# Patient Record
Sex: Female | Born: 1985 | Race: White | Hispanic: No | Marital: Married | Smoking: Current every day smoker
Health system: Southern US, Community
[De-identification: ages and names within clinical notes are randomized; demographics above are authoritative.]

## PROBLEM LIST (undated history)

## (undated) DIAGNOSIS — N809 Endometriosis, unspecified: Secondary | ICD-10-CM

## (undated) DIAGNOSIS — G43909 Migraine, unspecified, not intractable, without status migrainosus: Secondary | ICD-10-CM

## (undated) DIAGNOSIS — C801 Malignant (primary) neoplasm, unspecified: Secondary | ICD-10-CM

## (undated) DIAGNOSIS — F419 Anxiety disorder, unspecified: Secondary | ICD-10-CM

## (undated) HISTORY — PX: PELVIC LAPAROSCOPY: SHX162

## (undated) HISTORY — DX: Endometriosis, unspecified: N80.9

## (undated) HISTORY — DX: Anxiety disorder, unspecified: F41.9

## (undated) HISTORY — DX: Malignant (primary) neoplasm, unspecified: C80.1

## (undated) HISTORY — DX: Migraine, unspecified, not intractable, without status migrainosus: G43.909

## (undated) HISTORY — PX: TUBAL LIGATION: SHX77

---

## 2008-06-29 DIAGNOSIS — I059 Rheumatic mitral valve disease, unspecified: Secondary | ICD-10-CM | POA: Insufficient documentation

## 2011-08-11 DIAGNOSIS — N809 Endometriosis, unspecified: Secondary | ICD-10-CM | POA: Insufficient documentation

## 2013-02-27 DIAGNOSIS — F4310 Post-traumatic stress disorder, unspecified: Secondary | ICD-10-CM | POA: Insufficient documentation

## 2016-01-03 DIAGNOSIS — N920 Excessive and frequent menstruation with regular cycle: Secondary | ICD-10-CM | POA: Insufficient documentation

## 2019-05-28 DIAGNOSIS — F411 Generalized anxiety disorder: Secondary | ICD-10-CM | POA: Insufficient documentation

## 2019-07-09 ENCOUNTER — Emergency Department (INDEPENDENT_AMBULATORY_CARE_PROVIDER_SITE_OTHER): Payer: Medicaid Other

## 2019-07-09 ENCOUNTER — Emergency Department (INDEPENDENT_AMBULATORY_CARE_PROVIDER_SITE_OTHER)
Admission: EM | Admit: 2019-07-09 | Discharge: 2019-07-09 | Disposition: A | Discharge status: Home / Self Care | Payer: Medicaid Other | Source: Home / Self Care

## 2019-07-09 ENCOUNTER — Other Ambulatory Visit: Payer: Self-pay

## 2019-07-09 ENCOUNTER — Encounter: Payer: Self-pay | Admitting: Emergency Medicine

## 2019-07-09 DIAGNOSIS — W19XXXA Unspecified fall, initial encounter: Secondary | ICD-10-CM | POA: Diagnosis not present

## 2019-07-09 DIAGNOSIS — R0789 Other chest pain: Secondary | ICD-10-CM

## 2019-07-09 DIAGNOSIS — W010XXA Fall on same level from slipping, tripping and stumbling without subsequent striking against object, initial encounter: Secondary | ICD-10-CM

## 2019-07-09 DIAGNOSIS — S4992XA Unspecified injury of left shoulder and upper arm, initial encounter: Secondary | ICD-10-CM

## 2019-07-09 HISTORY — DX: Migraine, unspecified, not intractable, without status migrainosus: G43.909

## 2019-07-09 MED ORDER — ONDANSETRON 4 MG PO TBDP
4.0000 mg | ORAL_TABLET | Freq: Once | ORAL | Status: AC
  Administered 2019-07-09: 4 mg via ORAL

## 2019-07-09 MED ORDER — KETOROLAC TROMETHAMINE 60 MG/2ML IM SOLN
60.0000 mg | Freq: Once | INTRAMUSCULAR | Status: AC
  Administered 2019-07-09: 60 mg via INTRAMUSCULAR

## 2019-07-09 NOTE — Discharge Instructions (Signed)
  You may take 500mg  acetaminophen every 4-6 hours or in combination with ibuprofen 400-600mg  every 6-8 hours as needed for pain and inflammation.  You may also alternate cool and warm compresses  Call to schedule a follow up appointment with Sports Medicine this week for further evaluation and treatment of shoulder injury.

## 2019-07-09 NOTE — ED Provider Notes (Signed)
[name]    CSN: [id number] Arrival date & time: 07/09/19  1238      History   Chief Complaint Chief Complaint  Patient presents with  . Fall    HPI Jenny Oconnell is a 34 y.o. female.   HPI  Jenny Oconnell is a 34 y.o. female presenting to UC with c/o gradually worsening severe Left shoulder and side pain after tripping at home, falling onto her radiator.  Denies hitting her head or LOC.  Pain is aching and sore, worse with any movement of her Left shoulder.  Pain radiates into her neck and upper back.  Denies numbness in her Left arm.  She has taken Tylenol w/o relief.  Pain is 10/10. No prior hx of shoulder injury or surgeries.  Pt c/o Left side rib pain as well but denies SOB. No wounds. She has noticed a small bruise on her abdomen.   Past Medical History:  Diagnosis Date  . Migraines     There are no problems to display for this patient.   History reviewed. No pertinent surgical history.  OB History   No obstetric history on file.      Home Medications    Prior to Admission medications   Medication Sig Start Date End Date Taking? Authorizing Provider  butalbital-acetaminophen-caffeine (FIORICET) 50-325-40 MG tablet Take 1 tablet at the onset of migraine, may repeat 1 tablet in 4 hours not to exceed 2 doses. MUST LAST 30 DAYS! 09/25/18  Yes [provider]  gabapentin (NEURONTIN) 600 MG tablet Take by mouth. 03/12/19  Yes [provider]  metoprolol succinate (TOPROL-XL) 100 MG 24 hr tablet Take by mouth. 09/25/15  Yes [provider]  omeprazole (PRILOSEC) 40 MG capsule TAKE ONE CAPSULE BY MOUTH DAILY 04/06/18  Yes [provider]    Family History No family history on file.  Social History Social History   Tobacco Use  . Smoking status: Current Every Day Smoker  . Smokeless tobacco: Current User  Substance Use Topics  . Alcohol use: Not on file  . Drug use: Not on file     Allergies   Nsaids, Tramadol,  Aspirin, and Sulfamethoxazole-trimethoprim   Review of Systems Review of Systems  Cardiovascular: Positive for chest pain. Negative for palpitations and leg swelling.  Musculoskeletal: Positive for arthralgias, back pain, myalgias and neck pain. Negative for gait problem, joint swelling and neck stiffness.  Neurological: Negative for dizziness, weakness, light-headedness, numbness and headaches.     Physical Exam Triage Vital Signs ED Triage Vitals  Enc Vitals Group     BP 07/09/19 1254 121/84     Pulse Rate 07/09/19 1254 77     Resp --      Temp 07/09/19 1254 98.2 F (36.8 C)     Temp Source 07/09/19 1254 Oral     SpO2 07/09/19 1254 97 %     Weight 07/09/19 1255 138 lb 12 oz (62.9 kg)     Height 07/09/19 1255 5\' 6"  (1.676 m)     Head Circumference --      Peak Flow --      Pain Score 07/09/19 1254 10     Pain Loc --      Pain Edu? --      Excl. in [place]? --    No data found.  Updated Vital Signs BP 121/84 (BP Location: Right Arm)   Pulse 77   Temp 98.2 F (36.8 C) (Oral)   Ht 5\' 6"  (1.676  m)   Wt 138 lb 12 oz (62.9 kg)   LMP 07/04/2019   SpO2 97%   BMI 22.39 kg/m   Visual Acuity Right Eye Distance:   Left Eye Distance:   Bilateral Distance:    Right Eye Near:   Left Eye Near:    Bilateral Near:     Physical Exam Vitals and nursing note reviewed.  Constitutional:      General: She is not in acute distress.    Appearance: She is well-developed and normal weight.     Comments: Pt sitting in exam chair, eyes halfway open, appears drowsy but is cooperative during exam.  HENT:     Head: Normocephalic and atraumatic.  Neck:     Comments: No midline bone tenderness, no crepitus or step-offs.  Cardiovascular:     Rate and Rhythm: Normal rate and regular rhythm.     Pulses:          Radial pulses are 2+ on the left side.  Pulmonary:     Effort: Pulmonary effort is normal. No respiratory distress.     Breath sounds: Normal breath sounds.  Chest:     Musculoskeletal:        General: Tenderness present.     Cervical back: Normal range of motion and neck supple. No tenderness.     Comments: Left arm: guarding, limited ROM Left should but is able to adduct Left hand to Right shoulder. Abduction about 90 degrees. Left elbow: full ROM, non-tender. 5/5 grip strength No spinal tenderness.  Normal gait.  Skin:    General: Skin is warm and dry.     Capillary Refill: Capillary refill takes less than 2 seconds.  Neurological:     General: No focal deficit present.     Mental Status: She is oriented to person, place, and time.  Psychiatric:        Attention and Perception: Attention and perception normal.        Mood and Affect: Mood normal.        Speech: Speech normal.        Behavior: Behavior normal.      UC Treatments / Results  Labs (all labs ordered are listed, but only abnormal results are displayed) Labs Reviewed - No data to display  EKG   Radiology DG Ribs Unilateral W/Chest Left  Result Date: 07/09/2019 CLINICAL DATA:  Pt states she fell on Friday and injured her left ribs. C/o lateral pain that increases with movement EXAM: LEFT RIBS AND CHEST - 3+ VIEW COMPARISON:  None. FINDINGS: No displaced fracture or other bone lesions are seen involving the ribs. There is no evidence of pneumothorax or pleural effusion. Both lungs are clear. Heart size and mediastinal contours are within normal limits. IMPRESSION: No evidence of a left-sided rib fracture or pneumothorax. Electronically Signed   By: Audie Pinto M.D.   On: 07/09/2019 14:15   DG Shoulder Left  Result Date: 07/09/2019 CLINICAL DATA:  Pt states she fell on Friday and injured her left shoulder. C/o lateral pain. EXAM: LEFT SHOULDER - 2+ VIEW COMPARISON:  None. FINDINGS: There is no evidence of fracture or dislocation. There is no evidence of arthropathy or other focal bone abnormality. Soft tissues are unremarkable. IMPRESSION: Negative radiographs of the left  shoulder.  Is Electronically Signed   By: Audie Pinto M.D.   On: 07/09/2019 14:13    Procedures Procedures (including critical care time)  Medications Ordered in UC Medications  ondansetron (ZOFRAN-ODT) disintegrating tablet  4 mg (has no administration in time range)  ketorolac (TORADOL) injection 60 mg (60 mg Intramuscular Given 07/09/19 1316)    Initial Impression / Assessment and Plan / UC Course  I have reviewed the triage vital signs and the nursing notes.  Pertinent labs & imaging results that were available during my care of the patient were reviewed by me and considered in my medical decision making (see chart for details).     Reviewed imaging with pt Reassured no fracture or dislocation Encouraged conservative treatment and follow up with Sports Medicine next week for further evaluation and treatment of possible rotator cuff injury. Sling offered for comfort AVS provided  Final Clinical Impressions(s) / UC Diagnoses   Final diagnoses:  Shoulder injury, left, initial encounter  Fall from slip, trip, or stumble, initial encounter  Left-sided chest wall pain     Discharge Instructions      You may take 500mg  acetaminophen every 4-6 hours or in combination with ibuprofen 400-600mg  every 6-8 hours as needed for pain and inflammation.  You may also alternate cool and warm compresses  Call to schedule a follow up appointment with Sports Medicine this week for further evaluation and treatment of shoulder injury.      ED Prescriptions    None     PDMP not reviewed this encounter.   Noe Gens, [place] 07/09/19 1422

## 2019-07-09 NOTE — ED Triage Notes (Signed)
Patient tripped and fell onto a radiator Friday night.  Complaining of left sided pain that is radiating up into her neck.  Patient has been taking Tylenol.

## 2019-07-11 ENCOUNTER — Other Ambulatory Visit: Payer: Self-pay

## 2019-07-11 ENCOUNTER — Ambulatory Visit (INDEPENDENT_AMBULATORY_CARE_PROVIDER_SITE_OTHER): Payer: Medicaid Other | Admitting: Sports Medicine

## 2019-07-11 ENCOUNTER — Encounter: Payer: Self-pay | Admitting: Sports Medicine

## 2019-07-11 ENCOUNTER — Ambulatory Visit (INDEPENDENT_AMBULATORY_CARE_PROVIDER_SITE_OTHER): Payer: Medicaid Other

## 2019-07-11 DIAGNOSIS — R0789 Other chest pain: Secondary | ICD-10-CM | POA: Diagnosis not present

## 2019-07-11 DIAGNOSIS — G894 Chronic pain syndrome: Secondary | ICD-10-CM

## 2019-07-11 DIAGNOSIS — M542 Cervicalgia: Secondary | ICD-10-CM

## 2019-07-11 MED ORDER — PREDNISONE 50 MG PO TABS: ORAL_TABLET | ORAL | 0 refills | Status: DC

## 2019-07-11 MED ORDER — CYCLOBENZAPRINE HCL 10 MG PO TABS: ORAL_TABLET | ORAL | 0 refills | Status: DC

## 2019-07-11 NOTE — Assessment & Plan Note (Signed)
Shandel has significant hyperalgesia and allodynia to light touch. This is consistent with a chronic pain syndrome, there has in fact been significant pain medication misuse. At the follow-up we may consider neuropathics or SNRIs.

## 2019-07-11 NOTE — Assessment & Plan Note (Signed)
Jenny Oconnell had a fall recently, impacted her left chest. She is in so much pain I cannot really examine her. Rib series x-rays were negative, adding a CT of the chest to evaluate for occult rib or scapular fracture.

## 2019-07-11 NOTE — Assessment & Plan Note (Addendum)
Also after recent fall 4 days ago she has pain in her left side of her neck, radiating down to the elbow. No progressive weakness, adding neck x-rays, prednisone, Flexeril. Home rehab exercises given. She does have a history of polysubstance overdose resulting in aspiration pneumonia and necessitating hospitalization so we will avoid narcotics in this patient. Soft collar given.

## 2019-07-11 NOTE — Progress Notes (Addendum)
    Procedures performed today:    None.  Independent interpretation of notes and tests performed by another provider:   None.  Impression and Recommendations:    Chest wall pain Jenny Oconnell had a fall recently, impacted her left chest. She is in so much pain I cannot really examine her. Rib series x-rays were negative, adding a CT of the chest to evaluate for occult rib or scapular fracture.  Acute neck pain Also after recent fall 4 days ago she has pain in her left side of her neck, radiating down to the elbow. No progressive weakness, adding neck x-rays, prednisone, Flexeril. Home rehab exercises given. She does have a history of polysubstance overdose resulting in aspiration pneumonia and necessitating hospitalization so we will avoid narcotics in this patient. Soft collar given.  Chronic pain syndrome Jenny Oconnell has significant hyperalgesia and allodynia to light touch. This is consistent with a chronic pain syndrome, there has in fact been significant pain medication misuse. At the follow-up we may consider neuropathics or SNRIs.    ___________________________________________ Gwen Her. Dianah Field, M.D., ABFM., CAQSM. Primary Care and [place] Instructor of [place] of [facility] [employer]

## 2019-07-12 ENCOUNTER — Telehealth: Payer: Self-pay

## 2019-07-12 ENCOUNTER — Ambulatory Visit (INDEPENDENT_AMBULATORY_CARE_PROVIDER_SITE_OTHER): Payer: Medicaid Other

## 2019-07-12 DIAGNOSIS — R0789 Other chest pain: Secondary | ICD-10-CM

## 2019-07-12 NOTE — Telephone Encounter (Signed)
Patient advised of all recommendations.

## 2019-07-12 NOTE — Telephone Encounter (Signed)
Jenny Oconnell called because she is having a lot on neck pain. She states she is not sure how to lay in bed without causing more neck pain. She is very tired because she is unable to sleep. I did recommend she continue with the medications prescribed as directed. Also advised her to have someone help with her kids so she can rest. Please advise about how to sleep/lay, such as type of pillow or position.

## 2019-07-12 NOTE — Telephone Encounter (Signed)
At this point she can go to [facility] or [facility] and get a soft cervical collar to wear for maybe a week at the most.  Take the Flexeril high-dose at night and this will help her sleep as well.  She was just hospitalized for opiate and multisubstance overdose so we really cannot go stronger than the medications she is on right now.  In the future we may consider gabapentin as well.

## 2019-07-25 ENCOUNTER — Ambulatory Visit: Payer: Medicaid Other | Admitting: Sports Medicine

## 2019-08-31 ENCOUNTER — Other Ambulatory Visit: Payer: Self-pay

## 2019-08-31 ENCOUNTER — Encounter: Payer: Self-pay | Admitting: Emergency Medicine

## 2019-08-31 ENCOUNTER — Emergency Department
Admission: EM | Admit: 2019-08-31 | Discharge: 2019-08-31 | Disposition: A | Discharge status: Home / Self Care | Payer: Medicaid Other | Source: Home / Self Care | Attending: Family Medicine | Admitting: Family Medicine

## 2019-08-31 DIAGNOSIS — J0100 Acute maxillary sinusitis, unspecified: Secondary | ICD-10-CM

## 2019-08-31 DIAGNOSIS — Z8669 Personal history of other diseases of the nervous system and sense organs: Secondary | ICD-10-CM

## 2019-08-31 DIAGNOSIS — H6122 Impacted cerumen, left ear: Secondary | ICD-10-CM | POA: Diagnosis not present

## 2019-08-31 MED ORDER — FLUCONAZOLE 150 MG PO TABS: ORAL_TABLET | ORAL | 0 refills | Status: DC

## 2019-08-31 MED ORDER — AMOXICILLIN 875 MG PO TABS: 875.0000 mg | ORAL_TABLET | Freq: Two times a day (BID) | ORAL | 0 refills | Status: DC

## 2019-08-31 MED ORDER — BUTALBITAL-APAP-CAFFEINE 50-325-40 MG PO TABS: ORAL_TABLET | ORAL | 0 refills | Status: DC

## 2019-08-31 MED ORDER — PREDNISONE 20 MG PO TABS: ORAL_TABLET | ORAL | 0 refills | Status: DC

## 2019-08-31 NOTE — ED Provider Notes (Signed)
[facility]    CSN: [id number] Arrival date & time: 08/31/19  1417      History   Chief Complaint Chief Complaint  Patient presents with  . Sinus Problem    HPI Jenny Oconnell is a 34 y.o. female.   Patient developed a cold-like illness about 3 weeks ago with initial fatigue, headache, and nasal congestion followed by a cough.  She has had persistent facial pain and pressure, but no sore throat or fevers, chills, and sweats.  She states that she has had increased frequency of migraine headaches during this interval. She requests referral to the [facility].  She also requests refill of Fiorinal for her headaches.  The history is provided by the patient.    Past Medical History:  Diagnosis Date  . Anxiety   . Cancer ([place])   . Endometriosis   . Migraine   . Migraines     Patient Active Problem List   Diagnosis Date Noted  . Acute neck pain 07/11/2019  . Chest wall pain 07/11/2019  . Chronic pain syndrome 07/11/2019    Past Surgical History:  Procedure Laterality Date  . CESAREAN SECTION    . TUBAL LIGATION      OB History   No obstetric history on file.      Home Medications    Prior to Admission medications   Medication Sig Start Date End Date Taking? Authorizing Provider  amoxicillin (AMOXIL) 875 MG tablet Take 1 tablet (875 mg total) by mouth 2 (two) times daily. 08/31/19   Kandra Nicolas, MD  butalbital-acetaminophen-caffeine (FIORICET) [phone] MG tablet Take one tab PO at onset of migraine, may repeat 1 tab in 4 hours not to exceed 2 doses. 08/31/19   Kandra Nicolas, MD  gabapentin (NEURONTIN) 600 MG tablet Take by mouth. 03/12/19   [provider]  metoprolol succinate (TOPROL-XL) 100 MG 24 hr tablet Take by mouth. 09/25/15   [provider]  omeprazole (PRILOSEC) 40 MG capsule TAKE ONE CAPSULE BY MOUTH DAILY 04/06/18   [provider]  predniSONE (DELTASONE) 20 MG tablet Take one tab by  mouth twice daily for 4 days, then one daily. Take with food. 08/31/19   Kandra Nicolas, MD    Family History Family History  Problem Relation Age of Onset  . Endometriosis Mother   . Endometriosis Maternal Aunt   . Cancer Maternal Grandmother        lung,bone,brain  . Diabetes Maternal Grandmother   . Stroke Maternal Grandmother   . Cancer Maternal Grandfather        prostate  . Diabetes Maternal Grandfather   . Heart attack Maternal Grandfather   . Thyroid disease Maternal Grandfather   . Endometriosis Maternal Aunt     Social History Social History   Tobacco Use  . Smoking status: Current Every Day Smoker    Types: Cigarettes  . Smokeless tobacco: Current User  Substance Use Topics  . Alcohol use: Not Currently  . Drug use: Not on file     Allergies   Nsaids, Tramadol, Aspirin, and Sulfamethoxazole-trimethoprim   Review of Systems Review of Systems No sore throat + cough No pleuritic pain No wheezing + nasal congestion + post-nasal drainage + sinus pain/pressure No itchy/red eyes ? earache No hemoptysis No SOB No fever/chills + nausea No vomiting No abdominal pain No diarrhea No urinary symptoms No skin rash + fatigue No myalgias + headache Used OTC meds (Tylenol) without relief  Physical Exam Triage Vital Signs ED Triage Vitals  Enc Vitals Group     BP 08/31/19 1431 111/76     Pulse Rate 08/31/19 1431 72     Resp --      Temp 08/31/19 1431 98.2 F (36.8 C)     Temp Source 08/31/19 1431 Oral     SpO2 08/31/19 1431 100 %     Weight 08/31/19 1432 135 lb (61.2 kg)     Height 08/31/19 1432 5\' 6"  (1.676 m)     Head Circumference --      Peak Flow --      Pain Score 08/31/19 1431 6     Pain Loc --      Pain Edu? --      Excl. in [place]? --    No data found.  Updated Vital Signs BP 111/76 (BP Location: Right Arm)   Pulse 72   Temp 98.2 F (36.8 C) (Oral)   Ht 5\' 6"  (1.676 m)   Wt 61.2 kg   SpO2 100%   BMI 21.79 kg/m   Visual  Acuity Right Eye Distance:   Left Eye Distance:   Bilateral Distance:    Right Eye Near:   Left Eye Near:    Bilateral Near:     Physical Exam Nursing notes and Vital Signs reviewed. Appearance:  Patient appears stated age, and in no acute distress Eyes:  Pupils are equal, round, and reactive to light and accomodation.  Extraocular movement is intact.  Conjunctivae are not inflamed  Ears:  Right canal and tympanic membrane normal.  Left canal partly occluded with cerumen; post lavage by nurse, left canal and tympanic membrane normal. Nose:  Congested turbinates.  Maxillary and frontal sinus tenderness is present.  Pharynx:  Normal Neck:  Supple.  Mildly enlarged lateral nodes are present, tender to palpation on the left.   Lungs:  Clear to auscultation.  Breath sounds are equal.  Moving air well. Heart:  Regular rate and rhythm without murmurs, rubs, or gallops.  Abdomen:  Nontender without masses or hepatosplenomegaly.  Bowel sounds are present.  No CVA or flank tenderness.  Extremities:  No edema.  Skin:  No rash present.   UC Treatments / Results  Labs (all labs ordered are listed, but only abnormal results are displayed) Labs Reviewed - No data to display  EKG   Radiology No results found.  Procedures Procedures  Left ear lavage by nurse.  Medications Ordered in UC Medications - No data to display  Initial Impression / Assessment and Plan / UC Course  I have reviewed the triage vital signs and the nursing notes.  Pertinent labs & imaging results that were available during my care of the patient were reviewed by me and considered in my medical decision making (see chart for details).    Suspect viral URI as initiating event, exacerbated by perennial rhinitis. Begin amoxicillin and prednisone burst/taper. Will refill Fioricet for migraine headaches at patient's request (#14, no refill).  Controlled Substance Prescriptions I have consulted the [place] Controlled  Substances Registry for this patient, and feel the risk/benefit ratio today is favorable for proceeding with this prescription for a controlled substance.   At patient's request, will refer to [facility] in [place] (phone [phone]) for treatment of her migraine headaches. Patient also requests Rx for Diflucan for possible candida vaginitis after taking amoxicillin. Followup with Family Doctor if not improved in about 10 days.  Final Clinical Impressions(s) / UC Diagnoses   Final diagnoses:  Acute maxillary sinusitis, recurrence not specified  Impacted cerumen of left ear  History of migraine headaches     Discharge Instructions     Take plain guaifenesin (1200mg  extended release tabs such as Mucinex) twice daily, with plenty of water, for cough and congestion.  Get adequate rest.   May use Afrin nasal spray (or generic oxymetazoline) each morning for about 5 days and then discontinue.  Also recommend using saline nasal spray several times daily and saline nasal irrigation (AYR is a common brand).  Use Flonase nasal spray each morning after using Afrin nasal spray and saline nasal irrigation. Try warm salt water gargles for sore throat.  Stop all antihistamines for now, and other non-prescription cough/cold preparations. May take Delsym Cough Suppressant at bedtime for nighttime cough.     ED Prescriptions    Medication Sig Dispense Auth. Provider   amoxicillin (AMOXIL) 875 MG tablet Take 1 tablet (875 mg total) by mouth 2 (two) times daily. 20 tablet Kandra Nicolas, MD   predniSONE (DELTASONE) 20 MG tablet Take one tab by mouth twice daily for 4 days, then one daily. Take with food. 12 tablet Kandra Nicolas, MD   butalbital-acetaminophen-caffeine (FIORICET) [phone] MG tablet Take one tab PO at onset of migraine, may repeat 1 tab in 4 hours not to exceed 2 doses. 14 tablet Kandra Nicolas, MD        Kandra Nicolas,  MD 08/31/19 [phone]

## 2019-08-31 NOTE — ED Triage Notes (Signed)
Sinus pain, pressure, headache, slight cough, nasal drainage 2-3 weeks

## 2019-08-31 NOTE — Discharge Instructions (Addendum)
Take plain guaifenesin (1200mg extended release tabs such as Mucinex) twice daily, with plenty of water, for cough and congestion.   Get adequate rest.   °May use Afrin nasal spray (or generic oxymetazoline) each morning for about 5 days and then discontinue.  Also recommend using saline nasal spray several times daily and saline nasal irrigation (AYR is a common brand).  Use Flonase nasal spray each morning after using Afrin nasal spray and saline nasal irrigation. °Try warm salt water gargles for sore throat.  °Stop all antihistamines for now, and other non-prescription cough/cold preparations. °May take Delsym Cough Suppressant at bedtime for nighttime cough.  °  °

## 2019-09-30 ENCOUNTER — Encounter: Payer: Self-pay | Admitting: Emergency Medicine

## 2019-09-30 ENCOUNTER — Other Ambulatory Visit: Payer: Self-pay

## 2019-09-30 ENCOUNTER — Emergency Department
Admission: EM | Admit: 2019-09-30 | Discharge: 2019-09-30 | Disposition: A | Discharge status: Home / Self Care | Payer: Medicaid Other | Source: Home / Self Care

## 2019-09-30 DIAGNOSIS — R0981 Nasal congestion: Secondary | ICD-10-CM

## 2019-09-30 DIAGNOSIS — R350 Frequency of micturition: Secondary | ICD-10-CM | POA: Diagnosis not present

## 2019-09-30 DIAGNOSIS — J3489 Other specified disorders of nose and nasal sinuses: Secondary | ICD-10-CM

## 2019-09-30 LAB — POCT URINALYSIS DIP (MANUAL ENTRY)
Bilirubin, UA: NEGATIVE
Glucose, UA: NEGATIVE mg/dL
Ketones, POC UA: NEGATIVE mg/dL
Leukocytes, UA: NEGATIVE
Nitrite, UA: NEGATIVE
Protein Ur, POC: NEGATIVE mg/dL
Spec Grav, UA: 1.02 (ref 1.010–1.025)
Urobilinogen, UA: 0.2 E.U./dL
pH, UA: 6 (ref 5.0–8.0)

## 2019-09-30 MED ORDER — FEXOFENADINE HCL 60 MG PO TABS: 60.0000 mg | ORAL_TABLET | Freq: Two times a day (BID) | ORAL | 0 refills | Status: DC

## 2019-09-30 MED ORDER — IPRATROPIUM BROMIDE 0.06 % NA SOLN: 2.0000 | Freq: Four times a day (QID) | NASAL | 1 refills | Status: DC

## 2019-09-30 NOTE — Discharge Instructions (Signed)
°Emergency Department Resource Guide °1) Find a Doctor and Pay Out of Pocket °Although you won't have to find out who is covered by your insurance plan, it is a good idea to ask around and get recommendations. You will then need to call the office and see if the doctor you have chosen will accept you as a new patient and what types of options they offer for patients who are self-pay. Some doctors offer discounts or will set up payment plans for their patients who do not have insurance, but you will need to ask so you aren't surprised when you get to your appointment. ° °2) Contact Your Local Health Department °Not all health departments have doctors that can see patients for sick visits, but many do, so it is worth a call to see if yours does. If you don't know where your local health department is, you can check in your phone book. The CDC also has a tool to help you locate your state's health department, and many state websites also have listings of all of their local health departments. ° °3) Find a Walk-in Clinic °If your illness is not likely to be very severe or complicated, you may want to try a walk in clinic. These are popping up all over the country in pharmacies, drugstores, and shopping centers. They're usually staffed by nurse practitioners or physician assistants that have been trained to treat common illnesses and complaints. They're usually fairly quick and inexpensive. However, if you have serious medical issues or chronic medical problems, these are probably not your best option. ° °No Primary Care Doctor: °- Call [facility] at  832-8000 - they can help you locate a primary care doctor that  accepts your insurance, provides certain services, etc. °- Physician Referral Service- 1-800-533-3463 ° °Chronic Pain Problems: °Organization         Address  Phone   Notes  ° [facility]  (336) 297-2271 Patients need to be referred by their primary care doctor.  ° °Medication  Assistance: °Organization         Address  Phone   Notes  °[place] [facility] [address] °[place], [place] [address] (336) 641-8030 --Must be a resident of [place] °-- Must have NO insurance coverage whatsoever (no Medicaid/ Medicare, etc.) °-- The pt. MUST have a primary care doctor that directs their care regularly and follows them in the community °  °[facility]  (866) 331-1348   °[employer]  (888) 892-1162   ° °Agencies that provide inexpensive medical care: °Organization         Address  Phone   Notes  °[facility]  (336) 832-8035   °[facility]    (336) 832-7272   °[facility] [address] °[place], [place] [address] (336) 832-4777   °[facility] [address], °[place] (336) 271-4999   °[facility]    (336) 373-0678   °[facility]    (336) 272-1050   °[facility] ° [address], [place] Phone:  (336) 832-4444, Fax:  (336) 832-4440 Hours of Operation:  9 am - 6 pm, M-F.  Also accepts Medicaid/Medicare and self-pay.  °[facility] ° [address], [place] Phone: (336) 832-3150, Fax: (336) 832-3151. Hours of Operation:  8:30 am - 5:30 pm, M-F.  Also accepts Medicaid and self-pay.  °[facility] [place] [address], [place] Phone: (336) 878-6027   °[facility] [address], [place], [place] (336)723-1848, Ext. 123 Mondays & Thursdays: 7-9 AM.  First 15 patients are seen on a first come, first serve basis. °  ° °Medicaid-accepting [place] Providers: ° °Organization         Address  Phone   Notes  °[facility] [address], [place] (336) 641-2100 Also accepts self-pay patients.  °[facility] [address], [place] ° (336) 856-9996   °[facility] [address], [place]  (336) 288-8857   °[facility] [address] [place] [address], [place] (336) 299-7000   °Veita Bland [address], [place]  ° (336) 373-1557 Only accepts [place] Access Medicaid patients after they have their name applied to their card.  ° °Self-Pay (no insurance) in [place]: ° °Organization         Address  Phone   Notes  °Sickle Cell Patients, [facility] [address], [place] (336) 832-1970   °[facility] [address], [place] (336) 832-4400   °[facility] ° [address], [place] (336) 992-4800   °[facility]/Dr. Osei-Bonsu ° [address] [place] [address], [place] or [address], [place] (336) 841-8500 Phone number for both [place] and [place] locations is the same.  °[facility] [address], [place] (336) 299-0000   °[facility] [address] [place] [address], [place] or [address] (336) 852-7530 °(336) 878-2260   °[facility] [address], [place] (336) 350-1642, phone; (336) 294-5005, fax Sees patients 1st and 3rd Saturday of every month.  Must not qualify for public or private insurance (i.e. Medicaid, Medicare, [place] Health Choice, Veterans' Benefits) • Household income should be no more than 200% of the poverty level •The clinic cannot treat you if you are pregnant or think you are pregnant • Sexually transmitted diseases are not treated at the clinic.  ° ° °Dental Care: °Organization         Address  Phone  Notes  °[place] [facility] [address], [place] (336) 641-6152 Accepts children up to age 21 who are enrolled in Medicaid or [place] Health Choice; pregnant women with a Medicaid card; and children who have applied for Medicaid or [place] Health Choice, but were declined, whose parents can pay a reduced fee at time of service.  °[place]  [facility] [place]  [address], [place] (336) 641-7733 Accepts children up to age 21 who are enrolled in Medicaid or [place] Health Choice; pregnant women with a Medicaid card; and children who have applied for Medicaid or [place] Health Choice, but were declined, whose parents can pay a reduced fee at time of service.  °[facility] ° [address], [place] (336) 641-4533 Patients are seen by appointment only. Walk-ins are not accepted. [facility] will see patients 18 years of age and older. °Monday - Tuesday (8am-5pm) °Most Wednesdays (8:30-5pm) °$30 per visit, cash only  °[facility] ° [address], [place] (336) 641-4533 Patients are seen by appointment only. Walk-ins are not accepted. [facility] will see patients 18 years of age and older. °One   Wednesday Evening (Monthly: Volunteer Based).  $30 per visit, cash only  °[facility]  (919) 537-3737 for adults; Children under age 4, call Graduate Pediatric Dentistry at ([phone]) [phone]. Children aged 4-14, please call ([phone]) [phone] to request a pediatric application. ° Dental services are provided in all areas of dental care including fillings, crowns and bridges, complete and partial dentures, implants, gum treatment, root canals, and extractions. Preventive care is also provided. Treatment is provided to both adults and children. °Patients are selected via a lottery and there is often a waiting list. °  °[facility] [address], °[place] ° (336) 763-8833 [email or web address] °  °[facility] [address], [place], [place] (336)723-1848, Ext. 123 Second and Fourth Thursday of each month, opens at 6:30 AM; Clinic ends at 9 AM.  Patients are seen on a first-come first-served basis, and a limited number are seen during each clinic.  ° °[facility] ° [address], [place], [place] (336) 723-7904    Eligibility Requirements °You must have lived in [place], [place], or [place] counties for at least the last three months. °  You cannot be eligible for state or federal sponsored healthcare insurance, including Veterans Administration, Medicaid, or Medicare. °  You generally cannot be eligible for healthcare insurance through your employer.  °  How to apply: °Eligibility screenings are held every Tuesday and Wednesday afternoon from 1:00 pm until 4:00 pm. You do not need an appointment for the interview!  °[facility] [address], [place], [place] 336-631-2330   °[facility]  336-342-8273   °[facility]  336-703-3100   °[facility]  336-570-6415   ° °Behavioral Health Resources in the Community: °Intensive Outpatient Programs °Organization         Address  Phone  Notes  °[facility] [address], [place], [place] 336-878-6098   °[facility] [address], [place], [place] 336-832-9800   °[facility] [address], [place], [place] ° 336-882-2125   °[facility] [address],  °[place], [place] 1-800-853-5163 or 336-641-4981   °Substance Abuse Resources °Organization         Address  Phone  Notes  °[facility]  336-882-2125   °[facility]  336-784-9470   °[facility]  336-285-9073   °[facility]  336-845-3988   °Residential & Outpatient Substance Abuse Program  1-800-659-3381   °Psychological Services °Organization         Address  Phone  Notes  °[facility]  336- 832-9600   °[facility]  336- 378-7881   °[facility] [address], [place] 1-800-853-5163 or 336-641-4981   ° °Mobile Crisis Teams °Organization         Address  Phone  Notes  °[facility], Mobile Crisis Care Unit  1-877-626-1772   °Assertive °[facility] ° [address]  [place], [place] 336-834-9664   °Sharon DeEsch [address] °[place] 336-554-5454   ° °Self-Help/Support Groups °Organization         Address  Phone             Notes  °[facility] [place] - variety of support groups  336- 373-1402 Call for more information  °Narcotics Anonymous (NA), [facility] [address], °[place]  2 meetings at this location  ° °  Residential Treatment Programs °Organization         Address  Phone  Notes  °[facility] [address],    °[place]  1-866-801-8205   °[facility] ° [address], [place], [place] 704-293-8524   °[facility] [address], [place] 336-845-3988 Admissions: 8am-3pm M-F  °[facility] [address]    °[place], [place] 336-841-1104   °[facility] [address], [place], [place] 336-379-7146   °[facility] [address]  °[place], [place] 336-285-9073   °[facility] Intensive Outpatient [address], [place], [place] 336-852-3033   °[facility] [address]  °[place], [place] 1-877-615-2722 or 336-784-9470   °[facility] [address] [place], [place] 336-227-7417 Accepts Medicaid  °[facility] [address]  ° [place] 1-800-659-3381 Substance Abuse/Addiction Treatment  ° °[place] Behavioral Health Resources °Organization         Address  Phone  Notes  °[facility]  (888) 581-9988   °Julie Brannon, PhD [address] [place], [place]   (336) 349-5553 or (336) 951-0000   °[facility]   [address] °[place], [place] (336) 349-4454   °[facility] [address], [place], [place] (336) 342-8316 Insurance/Medicaid/sponsorship through [facility]  °[facility] [address]                                    [place], [place] (336) 342-8316 Therapy/tele-psych/case    °[facility] [address]  ° , [place] (336) 349-2233    °Dr. Arfeen  (336) 349-4544   °[facility] [place]  [employer] [place] [facility] 1) [address],  °2) [address], [place] °3)  [address], [place] (336) 349-3220 °(336) 342-7768 ° °(336) 342-8140   °[place] [facility] (336) 342-1394 or (336) 342-3537 (After Hours)    ° ° °

## 2019-09-30 NOTE — ED Provider Notes (Signed)
[facility]    CSN: [id number] Arrival date & time: 09/30/19  1307      History   Chief Complaint Chief Complaint  Patient presents with   Facial Pain    HPI Jenny Oconnell is a 34 y.o. female.   HPI  Jenny Oconnell is a 34 y.o. female presenting to UC with c/o 2-3 weeks of gradually worsening sinus congestion and pressure.  She was seen at [facility] 1 month ago with similar symptoms. She completed a 10 day course of Augmentin. Symptoms resolved for about 1 week but she has since developed recurrent headaches c/w her migraines. She does have a hx of migraines. She usually takes Fioricet, and received a refill last visit to [facility] but has not f/u with her neurologist or PCP as she reports being dropped from both practices for missing appointments. She does not currently have a PCP.  Denies fever, chills, n/v/d.  Pt reports taking tylenol and 1 gabapentin this morning w/o relief of HA.  Pt also c/o urinary frequency. Hx of UTIs in the past. Denies pain with urination.  Denies vaginal symptoms.  Past Medical History:  Diagnosis Date   Anxiety    Cancer [facility])    Endometriosis    Migraine    Migraines     Patient Active Problem List   Diagnosis Date Noted   Acute neck pain 07/11/2019   Chest wall pain 07/11/2019   Chronic pain syndrome 07/11/2019    Past Surgical History:  Procedure Laterality Date   CESAREAN SECTION     TUBAL LIGATION      OB History   No obstetric history on file.      Home Medications    Prior to Admission medications   Medication Sig Start Date End Date Taking? Authorizing Provider  butalbital-acetaminophen-caffeine (FIORICET) 50-325-40 MG tablet Take one tab PO at onset of migraine, may repeat 1 tab in 4 hours not to exceed 2 doses. 08/31/19   Kandra Nicolas, MD  fexofenadine (ALLEGRA) 60 MG tablet Take 1 tablet (60 mg total) by mouth 2 (two) times daily. 09/30/19 10/30/19  Noe Gens, PA-C  fluconazole (DIFLUCAN) 150 MG tablet  Take one tab PO.  Repeat in 72 hours. 08/31/19   Kandra Nicolas, MD  gabapentin (NEURONTIN) 600 MG tablet Take by mouth. 03/12/19   [provider]  ipratropium (ATROVENT) 0.06 % nasal spray Place 2 sprays into both nostrils 4 (four) times daily. 09/30/19   Noe Gens, PA-C  metoprolol succinate (TOPROL-XL) 100 MG 24 hr tablet Take by mouth. 09/25/15   [provider]  omeprazole (PRILOSEC) 40 MG capsule TAKE ONE CAPSULE BY MOUTH DAILY 04/06/18   [provider]  predniSONE (DELTASONE) 20 MG tablet Take one tab by mouth twice daily for 4 days, then one daily. Take with food. 08/31/19   Kandra Nicolas, MD    Family History Family History  Problem Relation Age of Onset   Endometriosis Mother    Endometriosis Maternal Aunt    Cancer Maternal Grandmother        lung,bone,brain   Diabetes Maternal Grandmother    Stroke Maternal Grandmother    Cancer Maternal Grandfather        prostate   Diabetes Maternal Grandfather    Heart attack Maternal Grandfather    Thyroid disease Maternal Grandfather    Endometriosis Maternal Aunt     Social History Social History   Tobacco Use   Smoking status: Current Every  Day Smoker    Types: Cigarettes   Smokeless tobacco: Current User  Substance Use Topics   Alcohol use: Not Currently   Drug use: Not on file     Allergies   Nsaids, Tramadol, Aspirin, and Sulfamethoxazole-trimethoprim   Review of Systems Review of Systems  Constitutional: Negative for chills and fever.  HENT: Positive for congestion, postnasal drip and sinus pressure. Negative for ear pain, sinus pain, sore throat, trouble swallowing and voice change.   Respiratory: Negative for cough and shortness of breath.   Cardiovascular: Negative for chest pain and palpitations.  Gastrointestinal: Negative for abdominal pain, diarrhea, nausea and vomiting.  Musculoskeletal: Negative for arthralgias, back pain and myalgias.  Skin: Negative for  rash.  Neurological: Positive for headaches. Negative for dizziness and light-headedness.  All other systems reviewed and are negative.    Physical Exam Triage Vital Signs ED Triage Vitals  Enc Vitals Group     BP 09/30/19 1321 115/79     Pulse Rate 09/30/19 1321 85     Resp --      Temp 09/30/19 1321 98.4 F (36.9 C)     Temp Source 09/30/19 1321 Oral     SpO2 09/30/19 1321 97 %     Weight --      Height --      Head Circumference --      Peak Flow --      Pain Score 09/30/19 1322 8     Pain Loc --      Pain Edu? --      Excl. in [place]? --    No data found.  Updated Vital Signs BP 115/79 (BP Location: Left Arm)    Pulse 85    Temp 98.4 F (36.9 C) (Oral)    SpO2 97%   Visual Acuity Right Eye Distance:   Left Eye Distance:   Bilateral Distance:    Right Eye Near:   Left Eye Near:    Bilateral Near:     Physical Exam Vitals and nursing note reviewed.  Constitutional:      General: She is not in acute distress.    Appearance: Normal appearance. She is well-developed. She is not ill-appearing, toxic-appearing or diaphoretic.     Comments: Pt lying on exam bed, appears lethargic, slight slurred speech but is cooperative during exam. NAD  HENT:     Head: Normocephalic and atraumatic.     Right Ear: Tympanic membrane and ear canal normal.     Left Ear: Tympanic membrane and ear canal normal.     Nose: Nose normal.     Right Sinus: No maxillary sinus tenderness or frontal sinus tenderness.     Left Sinus: No maxillary sinus tenderness or frontal sinus tenderness.     Mouth/Throat:     Lips: Pink.     Mouth: Mucous membranes are moist.     Pharynx: Oropharynx is clear. Uvula midline.  Eyes:     Extraocular Movements: Extraocular movements intact.     Conjunctiva/sclera: Conjunctivae normal.     Pupils: Pupils are equal, round, and reactive to light.  Cardiovascular:     Rate and Rhythm: Normal rate and regular rhythm.  Pulmonary:     Effort: Pulmonary effort is  normal. No respiratory distress.     Breath sounds: Normal breath sounds. No stridor. No wheezing, rhonchi or rales.  Musculoskeletal:        General: Normal range of motion.     Cervical back: Normal range of  motion.  Skin:    General: Skin is warm and dry.  Neurological:     Mental Status: She is oriented to person, place, and time. She is lethargic.     Comments: Slowed, slightly slurred speech but is alert to person, place and time. Normal gait.   Psychiatric:        Speech: Speech is delayed and slurred ( slight).        Behavior: Behavior normal. Behavior is cooperative.        Thought Content: Thought content normal.      UC Treatments / Results  Labs (all labs ordered are listed, but only abnormal results are displayed) Labs Reviewed  POCT URINALYSIS DIP (MANUAL ENTRY) - Abnormal; Notable for the following components:      Result Value   Blood, UA moderate (*)    All other components within normal limits    EKG   Radiology No results found.  Procedures Procedures (including critical care time)  Medications Ordered in UC Medications - No data to display  Initial Impression / Assessment and Plan / UC Course  I have reviewed the triage vital signs and the nursing notes.  Pertinent labs & imaging results that were available during my care of the patient were reviewed by me and considered in my medical decision making (see chart for details).     Pt does appear lethargic with slowed slightly slurred speech on exam. Pt reports taking 1 gabapentin PTA. She notes her stepfather drove her to UC and will be driving her home today. She denies thoughts of hurting herself or others.  No sinus tenderness on exam. Lungs: CTAB UA: unremarkable No evidence of bacterial infection at this time.  Encouraged symptomatic tx for sinus congestion. Advised pt additional Fioricet would not be refilled in the urgent care setting. Strongly encouraged pt to re-establish care with a  PCP for ongoing healthcare needs and to aid with referral to neurology for reported migraines. [employer] guide provided. Discussed symptoms that warrant emergent care in the ED. AVS provided.  Final Clinical Impressions(s) / UC Diagnoses   Final diagnoses:  Urinary frequency  Sinus congestion  Sinus pressure     Discharge Instructions       Emergency Department Resource Guide 1) Find a Doctor and Pay Out of Pocket Although you won't have to find out who is covered by your insurance plan, it is a good idea to ask around and get recommendations. You will then need to call the office and see if the doctor you have chosen will accept you as a new patient and what types of options they offer for patients who are self-pay. Some doctors offer discounts or will set up payment plans for their patients who do not have insurance, but you will need to ask so you aren't surprised when you get to your appointment.  2) Contact Your Local Health Department Not all health departments have doctors that can see patients for sick visits, but many do, so it is worth a call to see if yours does. If you don't know where your local health department is, you can check in your phone book. The CDC also has a tool to help you locate your state's health department, and many state websites also have listings of all of their local health departments.  3) Find a [facility] If your illness is not likely to be very severe or complicated, you may want to try a walk in clinic. These are  popping up all over the country in pharmacies, drugstores, and shopping centers. They're usually staffed by nurse practitioners or physician assistants that have been trained to treat common illnesses and complaints. They're usually fairly quick and inexpensive. However, if you have serious medical issues or chronic medical problems, these are probably not your best option.  No Primary Care Doctor: - Call Health Connect at   [phone] - they can help you locate a primary care doctor that  accepts your insurance, provides certain services, etc. - Physician Referral Service- [phone]  Chronic Pain Problems: Organization         Address  Phone   Notes  [facility]  [phone] Patients need to be referred by their primary care doctor.   Medication Assistance: Organization         Address  Phone   Notes  [facility] Medication [facility] [address] [place], [place] [address] [phone] --Must be a resident of [facility] -- Must have NO insurance coverage whatsoever (no Medicaid/ Medicare, etc.) -- The pt. MUST have a primary care doctor that directs their care regularly and follows them in the community   MedAssist  [phone]   [employer]  [phone]    Agencies that provide inexpensive medical care: Organization         Address                                                       Phone                                                                            Notes  [place]  [phone]   [facility]    [phone]   [facility] [place], [place] [address] [phone]   [place] [address], [place] [phone]   Planned Parenthood    [phone]   [facility]    [phone]   [place] and [address], [place] Phone:  [phone], Fax:  [phone] Hours of Operation:  9 am - 6 pm, M-F.  Also accepts Medicaid/Medicare and self-pay.  [facility] for [place] [address], [address], [place] Phone: [phone], Fax: [phone]. Hours of Operation:  8:30 am - 5:30 pm, M-F.  Also accepts Medicaid and self-pay.  [facility] [place] [address] [place] Phone: [phone]   [place], [place], [place]  [phone], Ext. [phone] Mondays & Thursdays: 7-9 AM.  First 15 patients are seen on a first come, first serve basis.    [place] Providers:  Organization         Address  Phone                               Notes  [facility] [address], [place] [phone] Also accepts self-pay patients.  [facility] [id number] [place], [place]  [phone]   [place], [address], [place] [phone]   [facility] [address] [place] [phone]   Lucianne Lei [address], [place]   [phone] Only accepts [place] Access [place] patients after they have their name applied to their card.   Self-Pay (no insurance) in [facility]:   Organization         Address                                                     Phone               Notes  Sickle Cell Patients, [facility] [phone]   [facility] [place] [phone]   [facility] [place]  [place], [place], [place] [phone]   [facility]/Dr. Osei-Bonsu  [address], [place] or [address], [place] [phone] Phone number for both [place] and [place] locations is the same.  [facility] [address] [place] [phone]   [facility] [address] [place] or [address] [phone]   [facility] [address] [place] [phone], phone; [phone], fax Sees patients 1st and 3rd Saturday of every month.  Must not qualify for public or private insurance (i.e. Medicaid, Medicare, [place] Health Choice, Veterans' Benefits)   Household income should be no more than 200% of the poverty level The clinic cannot treat you if you are pregnant or think you are pregnant  Sexually transmitted diseases are not treated at the clinic.    Dental Care: Organization         Address                                  Phone                       Notes  [facility] Department of [facility] [place] [phone] Accepts children up to age 103 who are enrolled in [place] or [place]; pregnant women with a Medicaid card; and children who have applied for Medicaid or [place] Health Choice, but were declined, whose parents can pay a reduced fee at time of service.  [facility] Department of [facility]  [address], [place] [phone] Accepts children up to age 13 who are enrolled in [place] or [place]; pregnant women with a Medicaid card; and children who have applied for Medicaid or [place] Health Choice, but were declined, whose parents can pay a reduced fee at time of  service.  [place] Adult Dental Access PROGRAM  [place] [phone] Patients are seen by appointment only. Walk-ins are not accepted. [place] will see patients 27 years of age and older. Monday - Tuesday (8am-5pm) Most Wednesdays (8:30-5pm) $30 per visit, cash only  [facility] Adult Dental Access PROGRAM  [address], [facility] [phone] Patients are seen by appointment only. Walk-ins are not accepted. [place] will see patients 62 years of age and older. One Wednesday Evening (Monthly: Volunteer Based).  $30 per visit, cash only  [place]  [phone] for adults; Children under age 51, call Graduate Pediatric Dentistry at [phone]. Children aged 81-14, please call [phone] to request a pediatric application.  Dental services are provided in all areas of dental care including fillings, crowns  and bridges, complete and partial dentures, implants, gum treatment, root canals, and extractions. Preventive care is also provided. Treatment is provided to both adults and children. Patients are selected via a lottery and there is often a waiting list.   [facility] [address], [place]  [phone] [email or web address]   [facility] [address] [place], [place] [phone], Ext. [phone] Second and Fourth Thursday of each month, opens at 6:30 AM; Clinic ends at 9 AM.  Patients are seen on a first-come first-served basis, and a limited number are seen during each clinic.   [facility]  [address] [place] Danker [place], [place] [phone]   Eligibility Requirements You must have lived in [place], [place], or [place] counties for at least the last three months.   You cannot be eligible for state or federal sponsored [employer], including [employer], [place], or [employer].   You generally cannot be eligible for healthcare insurance through your employer.    How to apply: Eligibility screenings are held every Tuesday and Wednesday afternoon from 1:00 pm until 4:00 pm. You do not need an appointment for the interview!  [facility] [address] [place], [place]   [place]  [place] Department  [place]  [phone]    Behavioral Health Resources in the Community: Intensive Outpatient Programs Organization         Address                                              Phone              Notes  [place]. [address], [place], [place] [phone]   [facility] Outpatient [address] [place], [place]   ADS: Alcohol & Drug Svcs [address], [place], [place]   [place] [address],  [place], [place] or [phone]   Substance Abuse Resources Organization         Address                                Phone  Notes  Alcohol and Drug Services  [phone]   [facility]  [phone]   The Home   [facility]  [phone]   Residential &  Outpatient Substance Abuse Program  [phone]   Psychological Services Organization         Address                                  Phone                Notes  [facility] Health  [place]  [phone]   [place] [address] [place] or [phone]    Mobile Crisis Teams Organization         Address  Phone  Notes  [facility]rapeutic Alternatives, Mobile Crisis Care Unit  [phone]   [facility]  [address] [place], [place]   [place] [address], [place] [phone]    Self-Help/Support Groups Organization         Address                         Phone             Notes  [place]. of [place] - variety of support groups  [place] Call for more information  Narcotics Anonymous (NA), [facility]aring Services [address], [employer] [place]  2 meetings at this location   Special educational needs teacher         Address                                                    Phone              Notes  [facility] Residential Treatment [place],    [place]  [phone]   [facility]  [address] [place] [id number], [place], [place]   [place], [place] [phone] Admissions: 8am-3pm M-F  Incentives Substance [place] [address],    [place], [place] [phone]   [facility] [address] [place], [place], [place]   [facility] [address],  [place], [place]   Insight Programs - Intensive Outpatient [place], [place], [facility]   [facility] ([place] [address]  [place], [place] [phone] or [phone]   [facility] Services (RTS) [address] [place], [place] Accepts Medicaid  [place] [address]  [place] [phone] Substance Abuse/Addiction Treatment   [facility] Organization         Address                                                            Phone                    Notes  [facility]  [phone]   Domenic Schwab, PhD [address] [place], [place]   [phone] or [phone]   [place]  Maralee Higuchi, [place] [phone]   [facility] [address], [place], [place] [phone] Insurance/Medicaid/sponsorship through [facility] and Families [address], [address] [place], [place] [phone] [place], [place] [phone]    Dr. Adele Schilder  [phone]   [facility] Dept. 1) [address], [place] 2) [place] 3)  [place] [place] [address], [place] [phone]  [phone]   [facility] Child Abuse Hotline 919-723-1256 or ([phone] (After Hours)          ED Prescriptions    Medication Sig Dispense Auth. Provider   ipratropium (ATROVENT) 0.06 % nasal spray Place 2 sprays into both nostrils 4 (four) times daily. 15 mL Olman Yono O, PA-C   fexofenadine (ALLEGRA) 60 MG tablet Take 1 tablet (60 mg total) by mouth 2 (two) times daily. 60 tablet Noe Gens, [place]     I have reviewed the PDMP during this encounter.   Noe Gens, [place] 10/01/19 1103

## 2019-09-30 NOTE — ED Triage Notes (Signed)
Patient c/o sinus pain, pressure, nasal drainage 2-3 weeks, possible kidney infection as well.

## 2020-11-11 ENCOUNTER — Emergency Department (INDEPENDENT_AMBULATORY_CARE_PROVIDER_SITE_OTHER): Payer: Medicaid Other

## 2020-11-11 ENCOUNTER — Encounter: Payer: Self-pay | Admitting: Emergency Medicine

## 2020-11-11 ENCOUNTER — Other Ambulatory Visit: Payer: Self-pay

## 2020-11-11 ENCOUNTER — Emergency Department
Admission: EM | Admit: 2020-11-11 | Discharge: 2020-11-11 | Disposition: A | Discharge status: Home / Self Care | Payer: Medicaid Other | Source: Home / Self Care

## 2020-11-11 DIAGNOSIS — R059 Cough, unspecified: Secondary | ICD-10-CM

## 2020-11-11 DIAGNOSIS — R509 Fever, unspecified: Secondary | ICD-10-CM | POA: Diagnosis not present

## 2020-11-11 DIAGNOSIS — R0602 Shortness of breath: Secondary | ICD-10-CM

## 2020-11-11 DIAGNOSIS — U071 COVID-19: Secondary | ICD-10-CM | POA: Diagnosis not present

## 2020-11-11 DIAGNOSIS — R5383 Other fatigue: Secondary | ICD-10-CM | POA: Diagnosis not present

## 2020-11-11 LAB — POC SARS CORONAVIRUS 2 AG -  ED: SARS Coronavirus 2 Ag: POSITIVE — AB

## 2020-11-11 MED ORDER — PREDNISONE 10 MG (21) PO TBPK: ORAL_TABLET | ORAL | 0 refills | Status: DC

## 2020-11-11 MED ORDER — PROMETHAZINE-DM 6.25-15 MG/5ML PO SYRP: 5.0000 mL | ORAL_SOLUTION | Freq: Two times a day (BID) | ORAL | 0 refills | Status: DC | PRN

## 2020-11-11 NOTE — ED Provider Notes (Signed)
[name]    CSN: [id number] Arrival date & time: 11/11/20  1022      History   Chief Complaint Chief Complaint  Patient presents with   Fever   Covid Exposure    HPI Jenny Oconnell is a 35 y.o. female.   Patient presents today with a 5 to 6-day history of URI symptoms.  She reports symptoms have been persistent and gradually worsening prompting evaluation today.  She has not had influenza or COVID-19 vaccinations.  Does report exposure to COVID-19 2 days prior to symptom onset.  She reports fever, body aches, abdominal pain, nausea, cough, shortness of breath, sore throat, sinus pressure.  Denies any vomiting, diarrhea, dizziness, syncope.  She has tried Tylenol without improvement of symptoms.  She is unable to take most over-the-counter medications due to allergies.  She denies any recent antibiotic use.  She is a current everyday smoker smoking her typical amount.  She denies history of chronic lung conditions such as asthma or COPD.  She is having difficulty with daily activities as result of symptoms.   Past Medical History:  Diagnosis Date   Anxiety    Cancer [facility])    Endometriosis    Migraine    Migraines     Patient Active Problem List   Diagnosis Date Noted   Acute neck pain 07/11/2019   Chest wall pain 07/11/2019   Chronic pain syndrome 07/11/2019    Past Surgical History:  Procedure Laterality Date   CESAREAN SECTION     TUBAL LIGATION      OB History   No obstetric history on file.      Home Medications    Prior to Admission medications   Medication Sig Start Date End Date Taking? Authorizing Provider  predniSONE (STERAPRED UNI-PAK 21 TAB) 10 MG (21) TBPK tablet As directed 11/11/20  Yes Arya Boxley K, PA-C  promethazine-dextromethorphan (PROMETHAZINE-DM) 6.25-15 MG/5ML syrup Take 5 mLs by mouth 2 (two) times daily as needed for cough. 11/11/20  Yes Jacobs Golab, Derry Skill, PA-C  butalbital-acetaminophen-caffeine (FIORICET) 50-325-40 MG tablet  Take one tab PO at onset of migraine, may repeat 1 tab in 4 hours not to exceed 2 doses. Patient not taking: Reported on 11/11/2020 08/31/19   Kandra Nicolas, MD  fexofenadine (ALLEGRA) 60 MG tablet Take 1 tablet (60 mg total) by mouth 2 (two) times daily. Patient not taking: Reported on 11/11/2020 09/30/19 10/30/19  Noe Gens, PA-C  fluconazole (DIFLUCAN) 150 MG tablet Take one tab PO.  Repeat in 72 hours. 08/31/19   Kandra Nicolas, MD  gabapentin (NEURONTIN) 600 MG tablet Take by mouth. Patient not taking: Reported on 11/11/2020 03/12/19   [provider]  ipratropium (ATROVENT) 0.06 % nasal spray Place 2 sprays into both nostrils 4 (four) times daily. Patient not taking: Reported on 11/11/2020 09/30/19   Noe Gens, PA-C  metoprolol succinate (TOPROL-XL) 100 MG 24 hr tablet Take by mouth. 09/25/15   [provider]  omeprazole (PRILOSEC) 40 MG capsule TAKE ONE CAPSULE BY MOUTH DAILY 04/06/18   [provider]    Family History Family History  Problem Relation Age of Onset   Endometriosis Mother    Cancer Maternal Grandmother        lung,bone,brain   Diabetes Maternal Grandmother    Stroke Maternal Grandmother    Cancer Maternal Grandfather        prostate   Diabetes Maternal Grandfather    Heart attack Maternal Grandfather  Thyroid disease Maternal Grandfather    Endometriosis Maternal Aunt    Endometriosis Maternal Aunt     Social History Social History   Tobacco Use   Smoking status: Every Day    Packs/day: 0.50    Pack years: 0.00    Types: Cigarettes   Smokeless tobacco: Current  Vaping Use   Vaping Use: Some days   Substances: Nicotine  Substance Use Topics   Alcohol use: Not Currently   Drug use: Never     Allergies   Nsaids, Tramadol, Aspirin, and Sulfamethoxazole-trimethoprim   Review of Systems Review of Systems  Constitutional:  Positive for activity change, appetite change, fatigue and fever.  HENT:  Positive for  congestion and sore throat. Negative for sinus pressure and sneezing.   Respiratory:  Positive for cough and shortness of breath.   Cardiovascular:  Negative for chest pain.  Gastrointestinal:  Positive for abdominal pain and nausea. Negative for diarrhea and vomiting.  Musculoskeletal:  Positive for arthralgias and myalgias.  Neurological:  Positive for headaches. Negative for dizziness and light-headedness.    Physical Exam Triage Vital Signs ED Triage Vitals  Enc Vitals Group     BP 11/11/20 1050 114/81     Pulse Rate 11/11/20 1050 85     Resp 11/11/20 1050 16     Temp 11/11/20 1050 98.9 F (37.2 C)     Temp Source 11/11/20 1050 Oral     SpO2 11/11/20 1050 98 %     Weight --      Height --      Head Circumference --      Peak Flow --      Pain Score 11/11/20 1051 5     Pain Loc --      Pain Edu? --      Excl. in [place]? --    No data found.  Updated Vital Signs BP 114/81 (BP Location: Right Arm)   Pulse 85   Temp 98.9 F (37.2 C) (Oral)   Resp 16   LMP 10/28/2020 (Approximate)   SpO2 98%   Visual Acuity Right Eye Distance:   Left Eye Distance:   Bilateral Distance:    Right Eye Near:   Left Eye Near:    Bilateral Near:     Physical Exam Vitals reviewed.  Constitutional:      General: She is awake. She is not in acute distress.    Appearance: Normal appearance. She is normal weight. She is not ill-appearing.     Comments: Very pleasant female appears stated age in no acute distress sitting comfortably in exam room  HENT:     Head: Normocephalic and atraumatic.     Right Ear: Tympanic membrane, ear canal and external ear normal. There is impacted cerumen.     Left Ear: Tympanic membrane, ear canal and external ear normal. There is impacted cerumen.     Ears:     Comments: Cerumen noted bilaterally; able to visualize approximately 20% of TM that appears normal.    Nose:     Right Sinus: Maxillary sinus tenderness present.     Left Sinus: Maxillary sinus  tenderness present.     Mouth/Throat:     Pharynx: Uvula midline. Posterior oropharyngeal erythema present. No oropharyngeal exudate.     Tonsils: No tonsillar exudate or tonsillar abscesses. 1+ on the right. 1+ on the left.     Comments: Moderate erythema posterior pharynx. Cardiovascular:     Rate and Rhythm: Normal rate and regular  rhythm.     Heart sounds: Normal heart sounds, S1 normal and S2 normal. No murmur heard. Pulmonary:     Effort: Pulmonary effort is normal.     Breath sounds: Rhonchi present. No wheezing or rales.     Comments: Scattered rhonchi clear with cough Lymphadenopathy:     Head:     Right side of head: No submental, submandibular or tonsillar adenopathy.     Left side of head: No submental, submandibular or tonsillar adenopathy.     Cervical: No cervical adenopathy.  Psychiatric:        Behavior: Behavior is cooperative.     UC Treatments / Results  Labs (all labs ordered are listed, but only abnormal results are displayed) Labs Reviewed  POC SARS CORONAVIRUS 2 AG -  ED - Abnormal; Notable for the following components:      Result Value   SARS Coronavirus 2 Ag Positive (*)    All other components within normal limits    EKG   Radiology DG Chest 2 View  Result Date: 11/11/2020 CLINICAL DATA:  Cough, headache, fever EXAM: CHEST - 2 VIEW COMPARISON:  07/09/2019 FINDINGS: The heart size and mediastinal contours are within normal limits. Both lungs are clear. The visualized skeletal structures are unremarkable. IMPRESSION: Normal study. Electronically Signed   By: Rolm Baptise M.D.   On: 11/11/2020 12:15    Procedures Procedures (including critical care time)  Medications Ordered in UC Medications - No data to display  Initial Impression / Assessment and Plan / UC Course  I have reviewed the triage vital signs and the nursing notes.  Pertinent labs & imaging results that were available during my care of the patient were reviewed by me and  considered in my medical decision making (see chart for details).      Patient is positive for COVID-19 based on a rapid COVID test today.  Chest x-ray obtained showed no acute abnormalities.  No indication for antibiotics so we will start prednisone taper and patient was instructed not to take NSAIDs with this medication due to risk of GI bleeding.  She is outside of the 5-day window for oral antivirals.  Recommended use over-the-counter medications including Mucinex and Flonase for symptom relief.  Discussed alarm symptoms that warrant emergent evaluation.  Recommend she follow-up with either our clinic or PCP within 1 week to ensure improvement of symptoms.  Strict return precautions given to which patient expressed understanding.  Final Clinical Impressions(s) / UC Diagnoses   Final diagnoses:  COVID-19  Cough  Shortness of breath     Discharge Instructions      Your chest x-ray was normal.  You are positive for COVID.  We are going to start you on a prednisone taper to help with your breathing.  Please do not take NSAIDs including aspirin, ibuprofen/Advil, naproxen/Aleve with this medication as it can cause stomach bleeding.  You can continue Tylenol for pain relief.  I have called in Promethazine DM for cough.  This make you sleepy do not drive or drink alcohol with it.  If you have any worsening symptoms including shortness of breath, high fever, nausea/vomiting, weakness, chest pain you need to go to the emergency room.  Please follow-up with your primary care provider within 1 week.     ED Prescriptions     Medication Sig Dispense Auth. Provider   predniSONE (STERAPRED UNI-PAK 21 TAB) 10 MG (21) TBPK tablet As directed 21 tablet Diamonds Lippard K, PA-C   promethazine-dextromethorphan (  PROMETHAZINE-DM) 6.25-15 MG/5ML syrup Take 5 mLs by mouth 2 (two) times daily as needed for cough. 118 mL Shiryl Ruddy K, PA-C      PDMP not reviewed this encounter.   Terrilee Croak,  PA-C 11/11/20 1243

## 2020-11-11 NOTE — Discharge Instructions (Addendum)
Your chest x-ray was normal.  You are positive for COVID.  We are going to start you on a prednisone taper to help with your breathing.  Please do not take NSAIDs including aspirin, ibuprofen/Advil, naproxen/Aleve with this medication as it can cause stomach bleeding.  You can continue Tylenol for pain relief.  I have called in Promethazine DM for cough.  This make you sleepy do not drive or drink alcohol with it.  If you have any worsening symptoms including shortness of breath, high fever, nausea/vomiting, weakness, chest pain you need to go to the emergency room.  Please follow-up with your primary care provider within 1 week.

## 2020-11-11 NOTE — ED Triage Notes (Signed)
Covid exposure on 7/5 Presents today w/ cough, body aches & fever at home since 11/06/20

## 2021-02-01 IMAGING — DX DG SHOULDER 2+V*L*
3 series · 3 of 3 positions shown · non-contrast
Comparison: None.

CLINICAL DATA: Pt states she fell on [REDACTED] and injured her left
shoulder. C/o lateral pain.

EXAM:
LEFT SHOULDER - 2+ VIEW

[shoulder grashey]
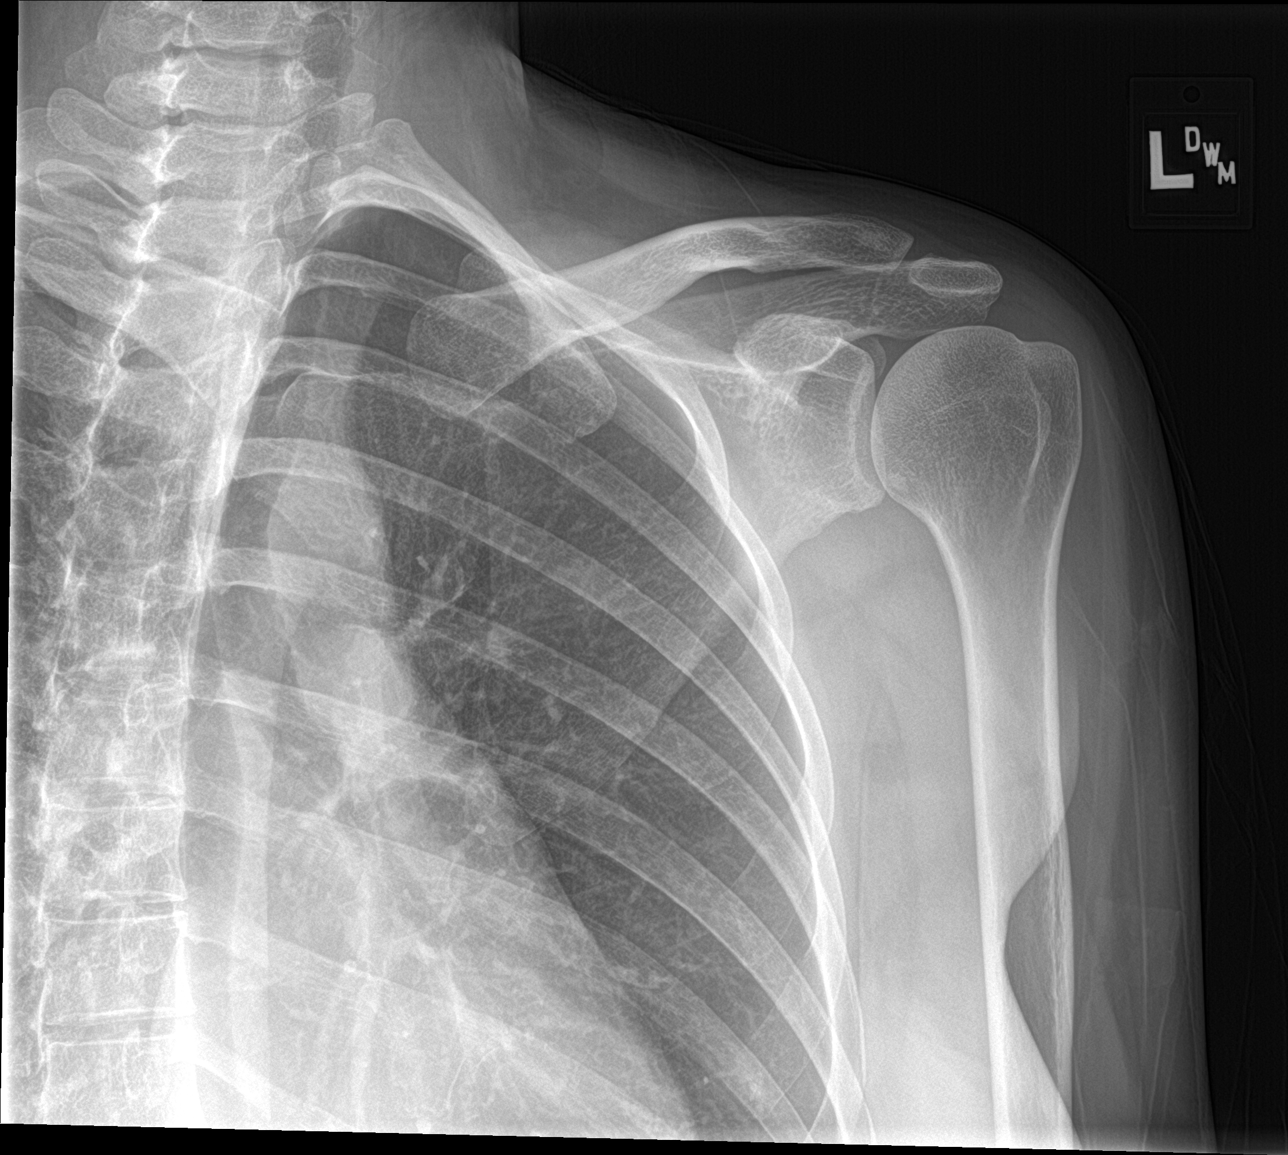

[shoulder axillary]
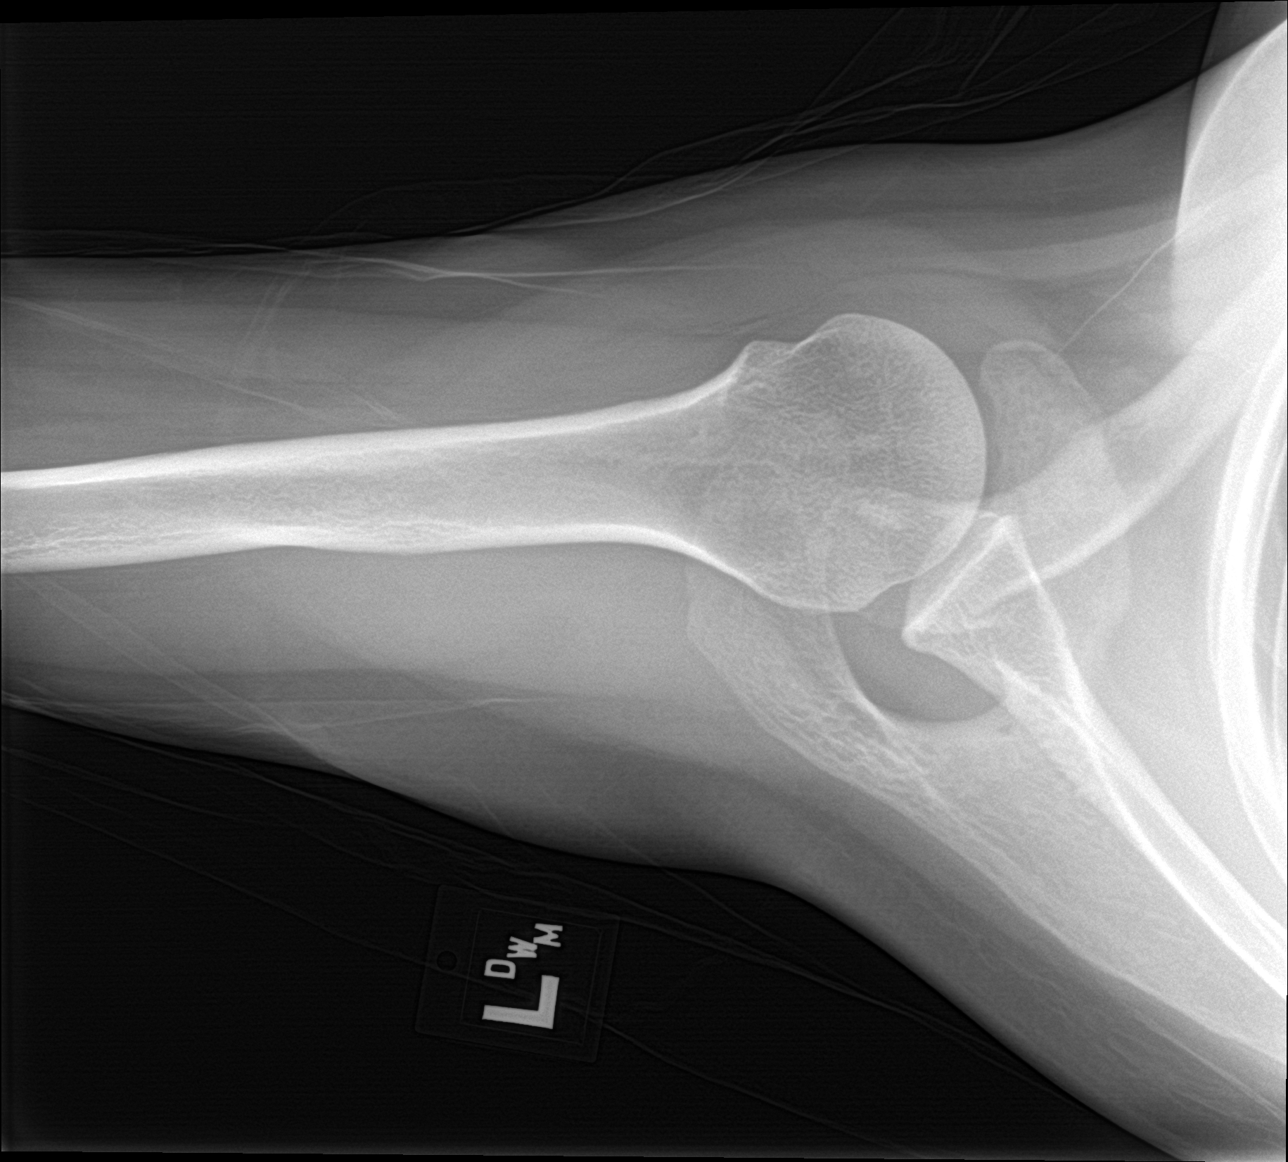

[shoulder y view]
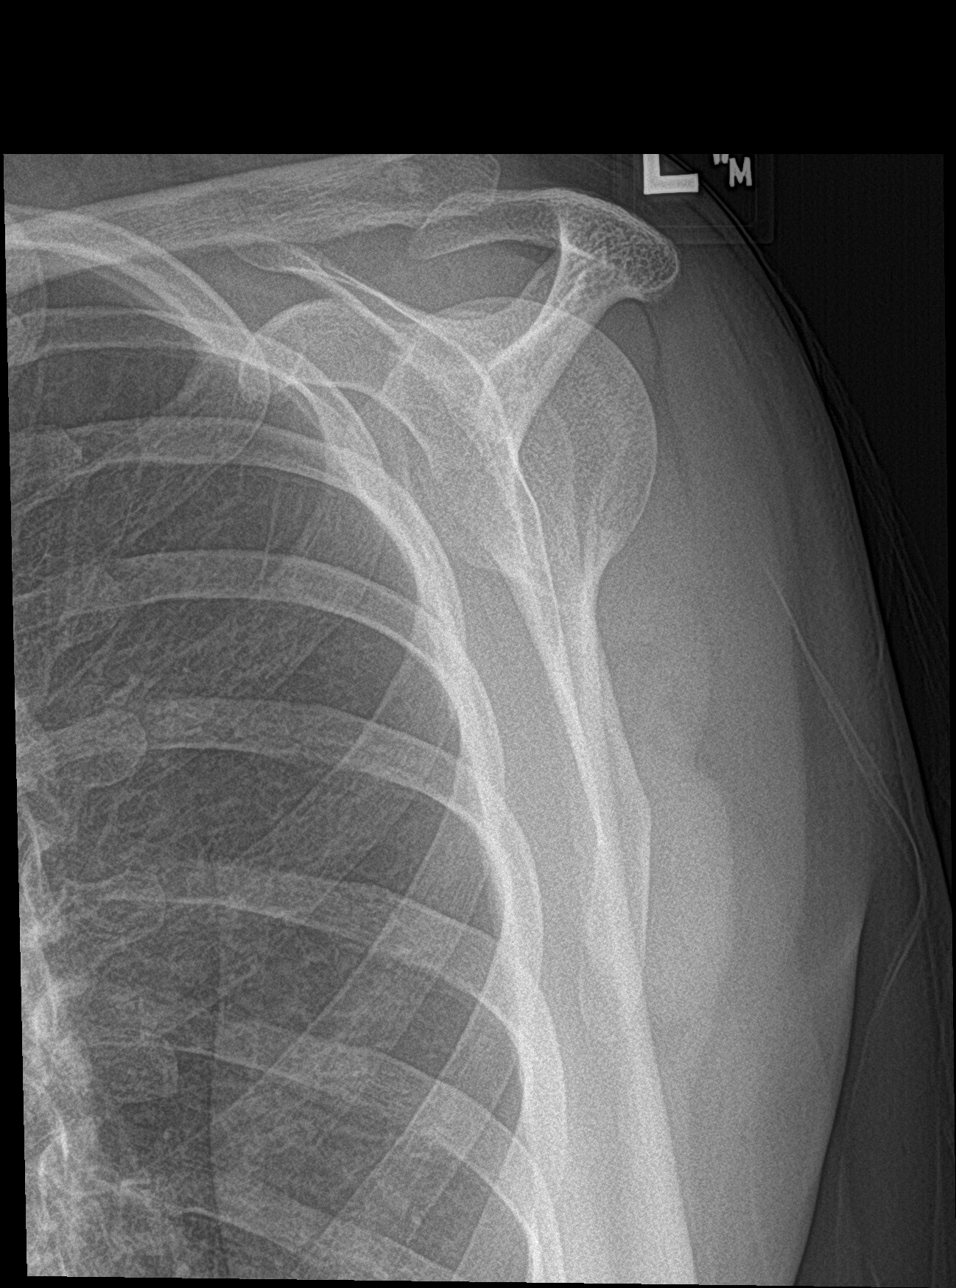

[3 of 3 positions shown; findings below may reference images not displayed]

FINDINGS: There is no evidence of fracture or dislocation. There is no
evidence of arthropathy or other focal bone abnormality. Soft
tissues are unremarkable.
IMPRESSION: Negative radiographs of the left shoulder.  Is

## 2021-03-03 ENCOUNTER — Emergency Department (INDEPENDENT_AMBULATORY_CARE_PROVIDER_SITE_OTHER)
Admission: EM | Admit: 2021-03-03 | Discharge: 2021-03-03 | Disposition: A | Discharge status: Home / Self Care | Payer: Medicaid Other | Source: Home / Self Care | Attending: Family Medicine | Admitting: Family Medicine

## 2021-03-03 ENCOUNTER — Other Ambulatory Visit: Payer: Self-pay

## 2021-03-03 ENCOUNTER — Emergency Department (INDEPENDENT_AMBULATORY_CARE_PROVIDER_SITE_OTHER): Payer: Medicaid Other

## 2021-03-03 DIAGNOSIS — L03811 Cellulitis of head [any part, except face]: Secondary | ICD-10-CM | POA: Diagnosis not present

## 2021-03-03 DIAGNOSIS — R52 Pain, unspecified: Secondary | ICD-10-CM

## 2021-03-03 MED ORDER — HYDROCODONE-ACETAMINOPHEN 5-325 MG PO TABS: 1.0000 | ORAL_TABLET | Freq: Four times a day (QID) | ORAL | 0 refills | Status: AC | PRN

## 2021-03-03 MED ORDER — DOXYCYCLINE HYCLATE 100 MG PO CAPS: ORAL_CAPSULE | ORAL | 0 refills | Status: DC

## 2021-03-03 MED ORDER — ONDANSETRON 4 MG PO TBDP
4.0000 mg | ORAL_TABLET | Freq: Once | ORAL | Status: AC
  Administered 2021-03-03: 4 mg via ORAL

## 2021-03-03 MED ORDER — ONDANSETRON 4 MG PO TBDP: 4.0000 mg | ORAL_TABLET | Freq: Three times a day (TID) | ORAL | 0 refills | Status: DC | PRN

## 2021-03-03 NOTE — Discharge Instructions (Signed)
Apply warm compress 3 to 4 times daily. If symptoms become significantly worse during the night or over the weekend, proceed to the local emergency room.  

## 2021-03-03 NOTE — ED Provider Notes (Signed)
[name]    CSN: [id number] Arrival date & time: 03/03/21  6644      History   Chief Complaint Chief Complaint  Patient presents with   skin lesion    head    HPI Jenny Oconnell is a 35 y.o. female.   Patient complains of development of a sore spot on her right posterior scalp three days ago, and she wonders if it could be a cyst.  She denies trauma or drainage from the lesion, reporting that it has become increasingly painful.  The history is provided by the patient.   Past Medical History:  Diagnosis Date   Anxiety    Cancer [facility])    Endometriosis    Migraine    Migraines     Patient Active Problem List   Diagnosis Date Noted   Acute neck pain 07/11/2019   Chest wall pain 07/11/2019   Chronic pain syndrome 07/11/2019    Past Surgical History:  Procedure Laterality Date   CESAREAN SECTION     TUBAL LIGATION      OB History   No obstetric history on file.      Home Medications    Prior to Admission medications   Medication Sig Start Date End Date Taking? Authorizing Provider  doxycycline (VIBRAMYCIN) 100 MG capsule Take one cap PO Q12hr with food. 03/03/21  Yes Kandra Nicolas, MD  HYDROcodone-acetaminophen (NORCO/VICODIN) 5-325 MG tablet Take 1 tablet by mouth every 6 (six) hours as needed for up to 7 days. 03/03/21 03/10/21 Yes Kandra Nicolas, MD  butalbital-acetaminophen-caffeine (FIORICET) [phone] MG tablet Take one tab PO at onset of migraine, may repeat 1 tab in 4 hours not to exceed 2 doses. Patient not taking: No sig reported 08/31/19   Kandra Nicolas, MD  fexofenadine (ALLEGRA) 60 MG tablet Take 1 tablet (60 mg total) by mouth 2 (two) times daily. Patient not taking: No sig reported 09/30/19 10/30/19  Noe Gens, PA-C  fluconazole (DIFLUCAN) 150 MG tablet Take one tab PO.  Repeat in 72 hours. 08/31/19   Kandra Nicolas, MD  gabapentin (NEURONTIN) 600 MG tablet Take by mouth. Patient not taking: No sig reported 03/12/19    [provider]  ipratropium (ATROVENT) 0.06 % nasal spray Place 2 sprays into both nostrils 4 (four) times daily. Patient not taking: No sig reported 09/30/19   Noe Gens, PA-C  metoprolol succinate (TOPROL-XL) 100 MG 24 hr tablet Take by mouth. 09/25/15   [provider]  omeprazole (PRILOSEC) 40 MG capsule TAKE ONE CAPSULE BY MOUTH DAILY 04/06/18   [provider]  predniSONE (STERAPRED UNI-PAK 21 TAB) 10 MG (21) TBPK tablet As directed 11/11/20   Raspet, Erin K, PA-C  promethazine-dextromethorphan (PROMETHAZINE-DM) 6.25-15 MG/5ML syrup Take 5 mLs by mouth 2 (two) times daily as needed for cough. 11/11/20   Raspet, Derry Skill, PA-C    Family History Family History  Problem Relation Age of Onset   Endometriosis Mother    Alcohol abuse Father    Cancer Maternal Grandmother        lung,bone,brain   Diabetes Maternal Grandmother    Stroke Maternal Grandmother    Cancer Maternal Grandfather        prostate   Diabetes Maternal Grandfather    Heart attack Maternal Grandfather    Thyroid disease Maternal Grandfather    Endometriosis Maternal Aunt    Endometriosis Maternal Aunt     Social History Social History   Tobacco Use  Smoking status: Every Day    Packs/day: 0.50    Types: Cigarettes   Smokeless tobacco: Current  Vaping Use   Vaping Use: Some days   Substances: Nicotine  Substance Use Topics   Alcohol use: Not Currently   Drug use: Never     Allergies   Nsaids, Tramadol, Aspirin, Decongestant [pseudoephedrine hcl], and Sulfamethoxazole-trimethoprim   Review of Systems Review of Systems  Constitutional:  Negative for activity change, appetite change, chills, diaphoresis, fatigue and fever.  HENT: Negative.    Eyes: Negative.   Respiratory: Negative.    Cardiovascular: Negative.   Gastrointestinal: Negative.   Genitourinary: Negative.   Musculoskeletal: Negative.   Skin:  Positive for color change.  Neurological:  Positive for  headaches.    Physical Exam Triage Vital Signs ED Triage Vitals  Enc Vitals Group     BP 03/03/21 1134 102/72     Pulse Rate 03/03/21 1134 63     Resp 03/03/21 1134 20     Temp 03/03/21 1134 98.4 F (36.9 C)     Temp Source 03/03/21 1134 Oral     SpO2 03/03/21 1134 98 %     Weight 03/03/21 1128 135 lb (61.2 kg)     Height 03/03/21 1128 5\' 6"  (1.676 m)     Head Circumference --      Peak Flow --      Pain Score 03/03/21 1128 10     Pain Loc --      Pain Edu? --      Excl. in [place]? --    No data found.  Updated Vital Signs BP 102/72 (BP Location: Left Arm)   Pulse 63   Temp 98.4 F (36.9 C) (Oral)   Resp 20   Ht 5\' 6"  (1.676 m)   Wt 61.2 kg   LMP 02/01/2021 (Approximate)   SpO2 98%   BMI 21.79 kg/m   Visual Acuity Right Eye Distance:   Left Eye Distance:   Bilateral Distance:    Right Eye Near:   Left Eye Near:    Bilateral Near:     Physical Exam Vitals and nursing note reviewed.  Constitutional:      General: She is not in acute distress. HENT:     Head: Normocephalic.      Comments: Right posterior scalp has a non-specific 0.5cm by 1cm patch of erythema, tender to palpation.  The area surrounding the lesion is also tender to palpation as noted on diagram.  There is no induration or fluctuance.      Right Ear: External ear normal.     Left Ear: External ear normal.  Eyes:     Pupils: Pupils are equal, round, and reactive to light.  Cardiovascular:     Rate and Rhythm: Normal rate.  Pulmonary:     Effort: Pulmonary effort is normal.  Musculoskeletal:     Cervical back: Neck supple.  Lymphadenopathy:     Cervical: No cervical adenopathy.  Skin:    General: Skin is warm and dry.  Neurological:     Mental Status: She is alert and oriented to person, place, and time.    UC Treatments / Results  Labs (all labs ordered are listed, but only abnormal results are displayed) Labs Reviewed - No data to display  EKG   Radiology DG Skull  Complete  Result Date: 03/03/2021 CLINICAL DATA:  Painful area of the right posterior scalp over the last 3 days. EXAM: SKULL - COMPLETE 4 + VIEW  COMPARISON:  None. FINDINGS: Normal appearance of the calvarium. No bone lesions seen. No discernible soft tissue finding by radiography. IMPRESSION: Negative radiographs. No calvarial abnormality in the region of concern. No discernible soft tissue lesion. Electronically Signed   By: Nelson Chimes M.D.   On: 03/03/2021 13:06    Procedures Procedures (including critical care time)  Medications Ordered in UC Medications  ondansetron (ZOFRAN-ODT) disintegrating tablet 4 mg (has no administration in time range)    Initial Impression / Assessment and Plan / UC Course  I have reviewed the triage vital signs and the nursing notes.  Pertinent labs & imaging results that were available during my care of the patient were reviewed by me and considered in my medical decision making (see chart for details).    Skull x-ray shows no underlying bony pathology.  Suspect cellulitis.  Begin empiric doxycycline. Administered Zofran ODT 4mg  PO at patient's request. Rx for Vicodin (#10, no refill). Controlled Substance Prescriptions I have consulted the [place] Controlled Substances Registry for this patient, and feel the risk/benefit ratio today is favorable for proceeding with this prescription for a controlled substance.   Followup with dermatologist if not improved one week.  Final Clinical Impressions(s) / UC Diagnoses   Final diagnoses:  Cellulitis of scalp     Discharge Instructions      Apply warm compress 3 to 4 times daily.  If symptoms become significantly worse during the night or over the weekend, proceed to the local emergency room.      ED Prescriptions     Medication Sig Dispense Auth. Provider   doxycycline (VIBRAMYCIN) 100 MG capsule Take one cap PO Q12hr with food. 14 capsule Kandra Nicolas, MD   HYDROcodone-acetaminophen  (NORCO/VICODIN) 5-325 MG tablet Take 1 tablet by mouth every 6 (six) hours as needed for up to 7 days. 10 tablet Kandra Nicolas, MD         Kandra Nicolas, MD 03/04/21 [phone]

## 2021-03-03 NOTE — ED Triage Notes (Signed)
Pt presents to Urgent Care with c/o painful "cyst" on her head x 3 days. Pink-colored skin lesion to scalp noted near crown on R side/back of head.

## 2021-08-16 ENCOUNTER — Emergency Department
Admission: EM | Admit: 2021-08-16 | Discharge: 2021-08-16 | Disposition: A | Discharge status: Home / Self Care | Payer: Medicaid Other | Source: Home / Self Care | Attending: Family Medicine | Admitting: Family Medicine

## 2021-08-16 ENCOUNTER — Emergency Department (INDEPENDENT_AMBULATORY_CARE_PROVIDER_SITE_OTHER): Payer: Medicaid Other

## 2021-08-16 DIAGNOSIS — Z8701 Personal history of pneumonia (recurrent): Secondary | ICD-10-CM

## 2021-08-16 DIAGNOSIS — R052 Subacute cough: Secondary | ICD-10-CM | POA: Diagnosis not present

## 2021-08-16 DIAGNOSIS — R059 Cough, unspecified: Secondary | ICD-10-CM

## 2021-08-16 MED ORDER — PREDNISONE 10 MG (21) PO TBPK: ORAL_TABLET | ORAL | 0 refills | Status: DC

## 2021-08-16 MED ORDER — PROMETHAZINE-DM 6.25-15 MG/5ML PO SYRP: 5.0000 mL | ORAL_SOLUTION | Freq: Two times a day (BID) | ORAL | 0 refills | Status: DC | PRN

## 2021-08-16 NOTE — ED Triage Notes (Signed)
Pt presents to Urgent Care with c/o productive cough x approx 2 months. Was dx w/ pneumonia approx 3 weeks ago--has taken two rounds of Doxycycline w/o improvement.  ?

## 2021-08-16 NOTE — Discharge Instructions (Signed)
Your chest x-ray is back to normal ?The chest pain on the right is probably pleurisy related to your prior pneumonia ?You may also have rib strain from the coughing ?I am prescribing a prednisone pack.  This will help take down the pain and inflammation of your rib cage. ?I am giving you a stronger cough medicine Promethazine DM ?Drink lots of fluids ?See your doctor if not better by next week ?

## 2021-08-16 NOTE — ED Provider Notes (Signed)
?[place] ? ? ? ?CSN: [id number] ?Arrival date & time: 08/16/21  1203 ? ? ?  ? ?History   ?Chief Complaint ?Chief Complaint  ?Patient presents with  ? Cough  ? ? ?HPI ?Jenny Oconnell is a 36 y.o. female.  ? ?HPI ? ?Patient states that she has been coughing for 2 months.  She went to her primary care doctor.  She was treated with a course of doxycycline for community pneumonia.  She states that she went back to see them after couple of weeks.  She was treated with another course of doxycycline.  She says that she has seen no improvement from this.  She continues to cough.  Her right chest hurts with deep breath.  She feels very tired.  No fever or chills.  No body aches.  She does continue to smoke cigarettes.  She states she does not have underlying lung disease or asthma ? ?Past Medical History:  ?Diagnosis Date  ? Anxiety   ? Cancer [facility])   ? Endometriosis   ? Migraine   ? Migraines   ? ? ?Patient Active Problem List  ? Diagnosis Date Noted  ? Acute neck pain 07/11/2019  ? Chest wall pain 07/11/2019  ? Chronic pain syndrome 07/11/2019  ? ? ?Past Surgical History:  ?Procedure Laterality Date  ? CESAREAN SECTION    ? TUBAL LIGATION    ? ? ?OB History   ?No obstetric history on file. ?  ? ? ? ?Home Medications   ? ?Prior to Admission medications   ?Medication Sig Start Date End Date Taking? Authorizing Provider  ?fluconazole (DIFLUCAN) 150 MG tablet Take one tab PO.  Repeat in 72 hours. 08/31/19   Kandra Nicolas, MD  ?gabapentin (NEURONTIN) 600 MG tablet Take by mouth. 03/12/19   [provider]  ?metoprolol succinate (TOPROL-XL) 100 MG 24 hr tablet Take by mouth. 09/25/15   [provider]  ?omeprazole (PRILOSEC) 40 MG capsule TAKE ONE CAPSULE BY MOUTH DAILY 04/06/18   [provider]  ?ondansetron (ZOFRAN-ODT) 4 MG disintegrating tablet Take 1 tablet (4 mg total) by mouth every 8 (eight) hours as needed for nausea or vomiting. Dissolve under tongue. 03/03/21   Kandra Nicolas, MD   ?predniSONE (STERAPRED UNI-PAK 21 TAB) 10 MG (21) TBPK tablet As directed 08/16/21   Raylene Everts, MD  ?promethazine-dextromethorphan (PROMETHAZINE-DM) 6.25-15 MG/5ML syrup Take 5 mLs by mouth 2 (two) times daily as needed for cough. 08/16/21   Raylene Everts, MD  ? ? ?Family History ?Family History  ?Problem Relation Age of Onset  ? Endometriosis Mother   ? Alcohol abuse Father   ? Cancer Maternal Grandmother   ?     lung,bone,brain  ? Diabetes Maternal Grandmother   ? Stroke Maternal Grandmother   ? Cancer Maternal Grandfather   ?     prostate  ? Diabetes Maternal Grandfather   ? Heart attack Maternal Grandfather   ? Thyroid disease Maternal Grandfather   ? Endometriosis Maternal Aunt   ? Endometriosis Maternal Aunt   ? ? ?Social History ?Social History  ? ?Tobacco Use  ? Smoking status: Every Day  ?  Packs/day: 0.50  ?  Types: Cigarettes  ? Smokeless tobacco: Never  ?Vaping Use  ? Vaping Use: Some days  ? Substances: Nicotine  ?Substance Use Topics  ? Alcohol use: Not Currently  ? Drug use: Never  ? ? ? ?Allergies   ?Nsaids, Tramadol, Azithromycin, Aspirin, Decongestant [pseudoephedrine hcl], and  Sulfamethoxazole-trimethoprim ? ? ?Review of Systems ?Review of Systems ? ?See HPI ?Physical Exam ?Triage Vital Signs ?ED Triage Vitals  ?Enc Vitals Group  ?   BP 08/16/21 1339 125/88  ?   Pulse Rate 08/16/21 1339 74  ?   Resp 08/16/21 1339 20  ?   Temp 08/16/21 1339 97.8 ?F (36.6 ?C)  ?   Temp Source 08/16/21 1339 Oral  ?   SpO2 08/16/21 1339 100 %  ?   Weight 08/16/21 1333 138 lb (62.6 kg)  ?   Height 08/16/21 1333 '5\' 6"'$  (1.676 m)  ?   Head Circumference --   ?   Peak Flow --   ?   Pain Score 08/16/21 1332 9  ?   Pain Loc --   ?   Pain Edu? --   ?   Excl. in [place]? --   ? ?No data found. ? ?Updated Vital Signs ?BP 125/88 (BP Location: Right Arm)   Pulse 74   Temp 97.8 ?F (36.6 ?C) (Oral)   Resp 20   Ht '5\' 6"'$  (1.676 m)   Wt 62.6 kg   LMP 08/02/2021 (Approximate)   SpO2 100%   BMI 22.27 kg/m?  ?    ? ?Physical Exam ?Constitutional:   ?   General: She is not in acute distress. ?   Appearance: Normal appearance. She is well-developed and normal weight. She is ill-appearing.  ?   Comments: Appears tired.  Smells strongly of tobacco  ?HENT:  ?   Head: Normocephalic and atraumatic.  ?   Right Ear: Tympanic membrane and ear canal normal.  ?   Left Ear: Tympanic membrane and ear canal normal.  ?   Nose: Nose normal.  ?   Mouth/Throat:  ?   Mouth: Mucous membranes are dry.  ?Eyes:  ?   Conjunctiva/sclera: Conjunctivae normal.  ?   Pupils: Pupils are equal, round, and reactive to light.  ?Cardiovascular:  ?   Rate and Rhythm: Normal rate and regular rhythm.  ?   Heart sounds: Normal heart sounds.  ?Pulmonary:  ?   Effort: Pulmonary effort is normal. No respiratory distress.  ?   Breath sounds: No wheezing or rhonchi.  ?Abdominal:  ?   General: There is no distension.  ?   Palpations: Abdomen is soft.  ?Musculoskeletal:     ?   General: Normal range of motion.  ?   Cervical back: Normal range of motion.  ?Lymphadenopathy:  ?   Cervical: No cervical adenopathy.  ?Skin: ?   General: Skin is warm and dry.  ?Neurological:  ?   Mental Status: She is alert.  ?Psychiatric:     ?   Mood and Affect: Mood normal.     ?   Behavior: Behavior normal.  ? ? ? ?UC Treatments / Results  ?Labs ?(all labs ordered are listed, but only abnormal results are displayed) ?Labs Reviewed - No data to display ? ?EKG ? ? ?Radiology ?DG Chest 2 View ? ?Result Date: 08/16/2021 ?CLINICAL DATA:  Cough EXAM: CHEST - 2 VIEW COMPARISON:  Chest x-ray 11/11/2020 FINDINGS: Heart size and mediastinal contours are within normal limits. No suspicious pulmonary opacities identified. No pleural effusion or pneumothorax visualized. No acute osseous abnormality appreciated. IMPRESSION: No acute intrathoracic process identified. Electronically Signed   By: Ofilia Neas M.D.   On: 08/16/2021 14:24   ? ?Procedures ?Procedures (including critical care  time) ? ?Medications Ordered in UC ?Medications - No data to display ? ?  Initial Impression / Assessment and Plan / UC Course  ?I have reviewed the triage vital signs and the nursing notes. ? ?Pertinent labs & imaging results that were available during my care of the patient were reviewed by me and considered in my medical decision making (see chart for details). ? ?  ?Final Clinical Impressions(s) / UC Diagnoses  ? ?Final diagnoses:  ?Subacute cough  ?History of pneumonia, recurrent  ? ? ? ?Discharge Instructions   ? ?  ?Your chest x-ray is back to normal ?The chest pain on the right is probably pleurisy related to your prior pneumonia ?You may also have rib strain from the coughing ?I am prescribing a prednisone pack.  This will help take down the pain and inflammation of your rib cage. ?I am giving you a stronger cough medicine Promethazine DM ?Drink lots of fluids ?See your doctor if not better by next week ? ? ?ED Prescriptions   ? ? Medication Sig Dispense Auth. Provider  ? promethazine-dextromethorphan (PROMETHAZINE-DM) 6.25-15 MG/5ML syrup Take 5 mLs by mouth 2 (two) times daily as needed for cough. 118 mL Raylene Everts, MD  ? predniSONE (STERAPRED UNI-PAK 21 TAB) 10 MG (21) TBPK tablet As directed 21 tablet Raylene Everts, MD  ? ?  ? ?PDMP not reviewed this encounter. ?  ?Raylene Everts, MD ?08/16/21 1518 ? ?

## 2022-05-25 ENCOUNTER — Ambulatory Visit
Admission: EM | Admit: 2022-05-25 | Discharge: 2022-05-25 | Discharge status: Absent without leave | Payer: Medicaid Other

## 2022-06-10 ENCOUNTER — Ambulatory Visit
Admission: RE | Admit: 2022-06-10 | Discharge: 2022-06-10 | Disposition: A | Discharge status: Home / Self Care | Payer: Medicaid Other | Source: Ambulatory Visit | Attending: Family Medicine | Admitting: Family Medicine

## 2022-06-10 VITALS — BP 122/84 | HR 69 | Temp 98.4°F | Resp 18 | Ht 66.0 in | Wt 145.0 lb

## 2022-06-10 DIAGNOSIS — G43909 Migraine, unspecified, not intractable, without status migrainosus: Secondary | ICD-10-CM | POA: Insufficient documentation

## 2022-06-10 DIAGNOSIS — F1910 Other psychoactive substance abuse, uncomplicated: Secondary | ICD-10-CM | POA: Insufficient documentation

## 2022-06-10 DIAGNOSIS — A084 Viral intestinal infection, unspecified: Secondary | ICD-10-CM

## 2022-06-10 DIAGNOSIS — Z9189 Other specified personal risk factors, not elsewhere classified: Secondary | ICD-10-CM | POA: Insufficient documentation

## 2022-06-10 LAB — POC SARS CORONAVIRUS 2 AG -  ED: SARS Coronavirus 2 Ag: NEGATIVE

## 2022-06-10 LAB — POCT INFLUENZA A/B
Influenza A, POC: NEGATIVE
Influenza B, POC: NEGATIVE

## 2022-06-10 MED ORDER — ONDANSETRON 4 MG PO TBDP
4.0000 mg | ORAL_TABLET | Freq: Once | ORAL | Status: AC
  Administered 2022-06-10: 4 mg via ORAL

## 2022-06-10 MED ORDER — ONDANSETRON 8 MG PO TBDP: 8.0000 mg | ORAL_TABLET | Freq: Three times a day (TID) | ORAL | 0 refills | Status: DC | PRN

## 2022-06-10 NOTE — Discharge Instructions (Signed)
Take the Zofran as needed for nausea and vomiting Increase your fluid intake May take Tylenol or ibuprofen for pain and fever See your doctor if not improving by next week

## 2022-06-10 NOTE — ED Provider Notes (Signed)
[facility]    CSN: [id number] Arrival date & time: 06/10/22  1247      History   Chief Complaint Chief Complaint  Patient presents with   Nausea    HPI Jenny Oconnell is a 37 y.o. female.   HPI  Patient known to me from prior visit.  Today she is here with nausea and vomiting.  Tired.  States she has had some chills probable fever.  Has been exposed to recently to influenza. Denies sore throat, runny nose or cough Medical history, medication list reviewed   Past Medical History:  Diagnosis Date   Anxiety    Cancer ([place])    Endometriosis    Migraine    Migraines     Patient Active Problem List   Diagnosis Date Noted   Substance abuse ([place]) 06/10/2022   History of repeated overdose 06/10/2022   Migraine 06/10/2022   Acute neck pain 07/11/2019   Chest wall pain 07/11/2019   Chronic pain syndrome 07/11/2019    Past Surgical History:  Procedure Laterality Date   CESAREAN SECTION     PELVIC LAPAROSCOPY     TUBAL LIGATION      OB History   No obstetric history on file.      Home Medications    Prior to Admission medications   Medication Sig Start Date End Date Taking? Authorizing Provider  amitriptyline (ELAVIL) 25 MG tablet Take 25 mg by mouth at bedtime. 05/10/22  Yes [provider]  DULoxetine (CYMBALTA) 60 MG capsule Take 60 mg by mouth daily. 05/08/22  Yes [provider]  ondansetron (ZOFRAN-ODT) 8 MG disintegrating tablet Take 1 tablet (8 mg total) by mouth every 8 (eight) hours as needed for nausea or vomiting. 06/10/22  Yes Raylene Everts, MD  QUEtiapine (SEROQUEL) 100 MG tablet Take 100 mg by mouth at bedtime. 06/01/22  Yes [provider]  QUEtiapine (SEROQUEL) 25 MG tablet Take 25 mg by mouth 2 (two) times daily as needed. 05/16/22  Yes [provider]  butalbital-acetaminophen-caffeine (FIORICET) 50-325-40 MG tablet Take 1 tablet by mouth daily as needed.    [provider]  gabapentin  (NEURONTIN) 600 MG tablet Take by mouth. 03/12/19   [provider]  metoprolol succinate (TOPROL-XL) 100 MG 24 hr tablet Take by mouth. 09/25/15   [provider]  omeprazole (PRILOSEC) 40 MG capsule TAKE ONE CAPSULE BY MOUTH DAILY 04/06/18   [provider]    Family History Family History  Problem Relation Age of Onset   Endometriosis Mother    Alcohol abuse Father    Cancer Maternal Grandmother        lung,bone,brain   Diabetes Maternal Grandmother    Stroke Maternal Grandmother    Cancer Maternal Grandfather        prostate   Diabetes Maternal Grandfather    Heart attack Maternal Grandfather    Thyroid disease Maternal Grandfather    Endometriosis Maternal Aunt    Endometriosis Maternal Aunt     Social History Social History   Tobacco Use   Smoking status: Every Day    Packs/day: 0.50    Types: Cigarettes   Smokeless tobacco: Never  Vaping Use   Vaping Use: Some days   Substances: Nicotine, Flavoring  Substance Use Topics   Alcohol use: Not Currently   Drug use: Not Currently     Allergies   Nsaids, Tramadol, Azithromycin, Aspirin, Decongestant [pseudoephedrine hcl], and Sulfamethoxazole-trimethoprim   Review of Systems Review of Systems  See HPI  Physical Exam Triage Vital Signs ED Triage Vitals  Enc Vitals Group     BP 06/10/22 1305 122/84     Pulse Rate 06/10/22 1305 69     Resp 06/10/22 1305 18     Temp 06/10/22 1305 98.4 F (36.9 C)     Temp Source 06/10/22 1305 Oral     SpO2 06/10/22 1305 97 %     Weight 06/10/22 1306 145 lb (65.8 kg)     Height 06/10/22 1306 '5\' 6"'$  (1.676 m)     Head Circumference --      Peak Flow --      Pain Score 06/10/22 1306 4     Pain Loc --      Pain Edu? --      Excl. in [place]? --    No data found.  Updated Vital Signs BP 122/84 (BP Location: Right Arm)   Pulse 69   Temp 98.4 F (36.9 C) (Oral)   Resp 18   Ht '5\' 6"'$  (1.676 m)   Wt 65.8 kg   LMP 06/10/2022 (Exact Date)   SpO2 97%    BMI 23.40 kg/m      Physical Exam Constitutional:      General: She is not in acute distress.    Appearance: She is well-developed. She is ill-appearing.  HENT:     Head: Normocephalic and atraumatic.  Eyes:     Conjunctiva/sclera: Conjunctivae normal.     Pupils: Pupils are equal, round, and reactive to light.  Cardiovascular:     Rate and Rhythm: Normal rate.  Pulmonary:     Effort: Pulmonary effort is normal. No respiratory distress.  Abdominal:     General: Abdomen is flat. There is no distension.     Palpations: Abdomen is soft.     Tenderness: There is no abdominal tenderness.  Musculoskeletal:        General: Normal range of motion.     Cervical back: Normal range of motion.  Skin:    General: Skin is warm and dry.  Neurological:     Mental Status: She is alert.      UC Treatments / Results  Labs (all labs ordered are listed, but only abnormal results are displayed) Labs Reviewed  POC SARS CORONAVIRUS 2 AG -  ED  POCT INFLUENZA A/B    EKG   Radiology No results found.  Procedures Procedures (including critical care time)  Medications Ordered in UC Medications  ondansetron (ZOFRAN-ODT) disintegrating tablet 4 mg (4 mg Oral Given 06/10/22 1304)    Initial Impression / Assessment and Plan / UC Course  I have reviewed the triage vital signs and the nursing notes.  Pertinent labs & imaging results that were available during my care of the patient were reviewed by me and considered in my medical decision making (see chart for details).     Final Clinical Impressions(s) / UC Diagnoses   Final diagnoses:  Viral gastroenteritis     Discharge Instructions      Take the Zofran as needed for nausea and vomiting Increase your fluid intake May take Tylenol or ibuprofen for pain and fever See your doctor if not improving by next week     ED Prescriptions     Medication Sig Dispense Auth. Provider   ondansetron (ZOFRAN-ODT) 8 MG disintegrating  tablet Take 1 tablet (8 mg total) by mouth every 8 (eight) hours as needed for nausea or vomiting. 20 tablet Raylene Everts, MD  PDMP not reviewed this encounter.   Raylene Everts, MD 06/10/22 1346

## 2022-06-10 NOTE — ED Triage Notes (Signed)
N/V since yesterday  Zofran given in triage  Denies fever Chills at home  Daughter had flu last week

## 2022-06-11 ENCOUNTER — Telehealth: Payer: Self-pay | Admitting: Emergency Medicine

## 2022-06-11 NOTE — Telephone Encounter (Signed)
Attempted to contact patient, no answer.  Doing a follow up regarding her appt to see how patient was doing and if she had any questions.

## 2024-02-17 ENCOUNTER — Ambulatory Visit: Payer: Self-pay

## 2024-02-17 ENCOUNTER — Ambulatory Visit (INDEPENDENT_AMBULATORY_CARE_PROVIDER_SITE_OTHER): Payer: MEDICAID

## 2024-02-17 ENCOUNTER — Ambulatory Visit
Admission: EM | Admit: 2024-02-17 | Discharge: 2024-02-17 | Disposition: A | Discharge status: Home / Self Care | Payer: MEDICAID | Source: Ambulatory Visit | Attending: Family Medicine | Admitting: Family Medicine

## 2024-02-17 ENCOUNTER — Other Ambulatory Visit: Payer: Self-pay

## 2024-02-17 DIAGNOSIS — R051 Acute cough: Secondary | ICD-10-CM | POA: Diagnosis not present

## 2024-02-17 DIAGNOSIS — R0789 Other chest pain: Secondary | ICD-10-CM

## 2024-02-17 DIAGNOSIS — F17200 Nicotine dependence, unspecified, uncomplicated: Secondary | ICD-10-CM | POA: Diagnosis not present

## 2024-02-17 MED ORDER — PREDNISONE 20 MG PO TABS: 40.0000 mg | ORAL_TABLET | Freq: Every day | ORAL | 0 refills | Status: DC

## 2024-02-17 MED ORDER — PROMETHAZINE-DM 6.25-15 MG/5ML PO SYRP: 5.0000 mL | ORAL_SOLUTION | Freq: Four times a day (QID) | ORAL | 0 refills | Status: DC | PRN

## 2024-02-17 MED ORDER — ALBUTEROL SULFATE HFA 108 (90 BASE) MCG/ACT IN AERS: 1.0000 | INHALATION_SPRAY | Freq: Four times a day (QID) | RESPIRATORY_TRACT | 0 refills | Status: AC | PRN

## 2024-02-17 MED ORDER — DOXYCYCLINE HYCLATE 100 MG PO CAPS: 100.0000 mg | ORAL_CAPSULE | Freq: Two times a day (BID) | ORAL | 0 refills | Status: DC

## 2024-02-17 NOTE — ED Provider Notes (Signed)
 Jenny Oconnell    CSN: [id number] Arrival date & time: 02/17/24  1035      History   Chief Complaint Chief Complaint  Patient presents with   Cough    HPI Jenny Oconnell is a 38 y.o. female.   Patient is known to me from prior visits.  She is a smoker.  She is here because she has had a cough for a month.  She has been exposed to pneumonia.  She has chest pain from coughing.  She is fatigued.  She is coughing up sputum.  She hurts around her right rib cage.  Does not know if she has had a fever.  Did not do any COVID or flu testing.  Is here because the cough is persisting.  Her aunt was recently diagnosed with pneumonia    Past Medical History:  Diagnosis Date   Anxiety    Cancer [facility])    Endometriosis    Migraine    Migraines     Patient Active Problem List   Diagnosis Date Noted   Substance abuse ([facility]) 06/10/2022   History of repeated overdose 06/10/2022   Migraine 06/10/2022   Acute neck pain 07/11/2019   Chest wall pain 07/11/2019   Chronic pain syndrome 07/11/2019    Past Surgical History:  Procedure Laterality Date   CESAREAN SECTION     PELVIC LAPAROSCOPY     TUBAL LIGATION      OB History   No obstetric history on file.      Home Medications    Prior to Admission medications   Medication Sig Start Date End Date Taking? Authorizing Provider  albuterol (VENTOLIN HFA) 108 (90 Base) MCG/ACT inhaler Inhale 1-2 puffs into the lungs every 6 (six) hours as needed for wheezing or shortness of breath. 02/17/24  Yes Maranda Jamee Jacob, MD  doxycycline  (VIBRAMYCIN ) 100 MG capsule Take 1 capsule (100 mg total) by mouth 2 (two) times daily. 02/17/24  Yes Maranda Jamee Jacob, MD  predniSONE  (DELTASONE ) 20 MG tablet Take 2 tablets (40 mg total) by mouth daily with breakfast. 02/17/24  Yes Maranda Jamee Jacob, MD  promethazine -dextromethorphan (PROMETHAZINE -DM) 6.25-15 MG/5ML syrup Take 5 mLs by mouth 4 (four) times daily as needed for cough. 02/17/24  Yes  Maranda Jamee Jacob, MD  gabapentin (NEURONTIN) 600 MG tablet Take by mouth. 03/12/19   [provider]  metoprolol succinate (TOPROL-XL) 100 MG 24 hr tablet Take by mouth. 09/25/15   [provider]    Family History Family History  Problem Relation Age of Onset   Endometriosis Mother    Alcohol abuse Father    Cancer Maternal Grandmother        lung,bone,brain   Diabetes Maternal Grandmother    Stroke Maternal Grandmother    Cancer Maternal Grandfather        prostate   Diabetes Maternal Grandfather    Heart attack Maternal Grandfather    Thyroid disease Maternal Grandfather    Endometriosis Maternal Aunt    Endometriosis Maternal Aunt     Social History Social History   Tobacco Use   Smoking status: Every Day    Current packs/day: 0.50    Types: Cigarettes   Smokeless tobacco: Never  Vaping Use   Vaping status: Some Days   Substances: Nicotine, Flavoring  Substance Use Topics   Alcohol use: Not Currently   Drug use: Not Currently     Allergies   Nsaids, Tramadol, Azithromycin, Aspirin, Decongestant [pseudoephedrine hcl], and Sulfamethoxazole-trimethoprim  Review of Systems Review of Systems See HPI  Physical Exam Triage Vital Signs ED Triage Vitals  Encounter Vitals Group     BP 02/17/24 1043 115/76     Girls Systolic BP Percentile --      Girls Diastolic BP Percentile --      Boys Systolic BP Percentile --      Boys Diastolic BP Percentile --      Pulse Rate 02/17/24 1043 78     Resp 02/17/24 1043 16     Temp 02/17/24 1043 97.9 F (36.6 C)     Temp src --      SpO2 02/17/24 1043 96 %     Weight --      Height --      Head Circumference --      Peak Flow --      Pain Score 02/17/24 1047 8     Pain Loc --      Pain Education --      Exclude from Growth Chart --    No data found.  Updated Vital Signs BP 115/76   Pulse 78   Temp 97.9 F (36.6 C)   Resp 16   LMP 01/24/2024 (Approximate)   SpO2 96%       Physical  Exam Constitutional:      General: She is not in acute distress.    Appearance: She is well-developed and normal weight. She is ill-appearing.     Comments: Smells of tobacco  HENT:     Head: Normocephalic and atraumatic.     Right Ear: Tympanic membrane normal.     Left Ear: Tympanic membrane normal.     Nose: Congestion present.     Mouth/Throat:     Pharynx: No posterior oropharyngeal erythema.  Eyes:     Conjunctiva/sclera: Conjunctivae normal.     Pupils: Pupils are equal, round, and reactive to light.  Cardiovascular:     Rate and Rhythm: Normal rate and regular rhythm.     Heart sounds: Normal heart sounds.  Pulmonary:     Effort: Pulmonary effort is normal. No respiratory distress.     Breath sounds: Rhonchi present.  Abdominal:     General: There is no distension.     Palpations: Abdomen is soft.  Musculoskeletal:        General: Normal range of motion.     Cervical back: Normal range of motion.  Lymphadenopathy:     Cervical: No cervical adenopathy.  Skin:    General: Skin is warm and dry.  Neurological:     Mental Status: She is alert.      UC Treatments / Results  Labs (all labs ordered are listed, but only abnormal results are displayed) Labs Reviewed - No data to display  EKG   Radiology DG Chest 2 View Result Date: 02/17/2024 CLINICAL DATA:  Cough x4 weeks. EXAM: CHEST - 2 VIEW COMPARISON:  August 16, 2021 FINDINGS: The heart size and mediastinal contours are within normal limits. Both lungs are clear. The visualized skeletal structures are unremarkable. IMPRESSION: No active cardiopulmonary disease. Electronically Signed   By: Suzen Dials M.D.   On: 02/17/2024 11:36    Procedures Procedures (including critical Oconnell time)  Medications Ordered in UC Medications - No data to display  Initial Impression / Assessment and Plan / UC Course  I have reviewed the triage vital signs and the nursing notes.  Pertinent labs & imaging results that  were available during my Oconnell  of the patient were reviewed by me and considered in my medical decision making (see chart for details).     Patient has a history of asthma.  She states she is out of her albuterol inhaler.  She states that at times she feels short of breath and might benefit from this.  It is refilled for her. Final Clinical Impressions(s) / UC Diagnoses   Final diagnoses:  Chest wall pain  Acute cough  Tobacco dependence     Discharge Instructions      I have prescribed doxycycline  antibiotic for the bronchitis.  Take this 2 times a day with food. I have prescribed prednisone  to take to help relieve congestion and wheezing.  May take 1 or 2 a day until symptoms are improved I have prescribed a stronger cough medicine to take at bedtime and when coughing is severe Use albuterol as needed for wheezing or shortness of breath See your doctor if not improving by next week     ED Prescriptions     Medication Sig Dispense Auth. Provider   doxycycline  (VIBRAMYCIN ) 100 MG capsule Take 1 capsule (100 mg total) by mouth 2 (two) times daily. 20 capsule Maranda Jamee Jacob, MD   promethazine -dextromethorphan (PROMETHAZINE -DM) 6.25-15 MG/5ML syrup Take 5 mLs by mouth 4 (four) times daily as needed for cough. 118 mL Maranda Jamee Jacob, MD   predniSONE  (DELTASONE ) 20 MG tablet Take 2 tablets (40 mg total) by mouth daily with breakfast. 10 tablet Maranda Jamee Jacob, MD   albuterol (VENTOLIN HFA) 108 (90 Base) MCG/ACT inhaler Inhale 1-2 puffs into the lungs every 6 (six) hours as needed for wheezing or shortness of breath. 18 g Maranda Jamee Jacob, MD      PDMP not reviewed this encounter.   Maranda Jamee Jacob, MD 02/17/24 1224

## 2024-02-17 NOTE — Discharge Instructions (Signed)
 I have prescribed doxycycline  antibiotic for the bronchitis.  Take this 2 times a day with food. I have prescribed prednisone  to take to help relieve congestion and wheezing.  May take 1 or 2 a day until symptoms are improved I have prescribed a stronger cough medicine to take at bedtime and when coughing is severe Use albuterol as needed for wheezing or shortness of breath See your doctor if not improving by next week

## 2024-02-17 NOTE — ED Triage Notes (Addendum)
 Has been sick for one month, has had cough, pains to right rib area. . Has been nauseated and sinus drainage. Has been coughing up dark sputum and white bubbly sputum. Aunt had pna. Has not checked for fever but has felt like she's had chills, sweating, probably had fever per pt. No otc meds.

## 2024-03-24 ENCOUNTER — Ambulatory Visit
Admission: EM | Admit: 2024-03-24 | Discharge: 2024-03-24 | Disposition: A | Discharge status: Home / Self Care | Payer: MEDICAID | Attending: Family Medicine | Admitting: Family Medicine

## 2024-03-24 DIAGNOSIS — Z5321 Procedure and treatment not carried out due to patient leaving prior to being seen by health care provider: Secondary | ICD-10-CM

## 2024-03-24 NOTE — ED Triage Notes (Signed)
 Pt presents to UC for coughing worse at night, nauseous, headache, congestion, sore right ribs from coughing since last visit here 1 month ago. Pt states she often coughs so much that she vomits. The cough is sometimes productive. Pt finished course of medications prescribed last visit. Did not take promethazine -DM d/t making her heart race.

## 2024-03-25 ENCOUNTER — Ambulatory Visit
Admission: RE | Admit: 2024-03-25 | Discharge: 2024-03-25 | Disposition: A | Discharge status: Home / Self Care | Payer: MEDICAID | Attending: Family Medicine | Admitting: Family Medicine

## 2024-03-25 VITALS — BP 117/81 | HR 89 | Temp 98.4°F | Resp 20 | Wt 163.0 lb

## 2024-03-25 DIAGNOSIS — R052 Subacute cough: Secondary | ICD-10-CM

## 2024-03-25 DIAGNOSIS — F17200 Nicotine dependence, unspecified, uncomplicated: Secondary | ICD-10-CM

## 2024-03-25 DIAGNOSIS — Z760 Encounter for issue of repeat prescription: Secondary | ICD-10-CM

## 2024-03-25 MED ORDER — GABAPENTIN 600 MG PO TABS: 600.0000 mg | ORAL_TABLET | Freq: Three times a day (TID) | ORAL | 1 refills | Status: AC

## 2024-03-25 MED ORDER — PREDNISONE 20 MG PO TABS: 40.0000 mg | ORAL_TABLET | Freq: Every day | ORAL | 0 refills | Status: AC

## 2024-03-25 MED ORDER — OMEPRAZOLE 20 MG PO CPDR: 20.0000 mg | DELAYED_RELEASE_CAPSULE | Freq: Every day | ORAL | 1 refills | Status: DC

## 2024-03-25 MED ORDER — AMOXICILLIN-POT CLAVULANATE 875-125 MG PO TABS: 1.0000 | ORAL_TABLET | Freq: Two times a day (BID) | ORAL | 0 refills | Status: DC

## 2024-03-25 MED ORDER — HYDROCOD POLI-CHLORPHE POLI ER 10-8 MG/5ML PO SUER: 5.0000 mL | Freq: Two times a day (BID) | ORAL | 0 refills | Status: AC | PRN

## 2024-03-25 NOTE — ED Provider Notes (Signed)
 [name]    CSN: [id number] Arrival date & time: 03/25/24  9182      History   Chief Complaint Chief Complaint  Patient presents with   Cough    Entered by patient    HPI Jenny Oconnell is a 38 y.o. female.   Jenny Oconnell is known to me from prior visit.  She is a smoker with chronic bronchitis and pneumonia illnesses, chronic cough, difficulty clearing respiratory infections.  She is here stating she has been coughing for 8 weeks.  Does not currently have a PCP.  She was seen by me early October and treated with a course of doxycycline  and prednisone .  She states she felt briefly improved but that the cough came right back.  She is coughing up brown sputum.  She is very exhausted from all the coughing.  Her chest hurts from the coughing.  She does have an albuterol  inhaler which she uses periodically.  She is still having bad coughing spells.  Difficulty sleeping secondary to the cough She also states she does not currently have a PCP.  She states she needs refill of her current medications including Prilosec, gabapentin .  She states she is also on metoprolol but has been off of this.  She is assisted in getting an appointment with a new PCP    Past Medical History:  Diagnosis Date   Anxiety    Cancer (HCC)    Endometriosis    Migraine    Migraines     Patient Active Problem List   Diagnosis Date Noted   Substance abuse (HCC) 06/10/2022   History of repeated overdose 06/10/2022   Migraine 06/10/2022   Acute neck pain 07/11/2019   Chest wall pain 07/11/2019   Chronic pain syndrome 07/11/2019    Past Surgical History:  Procedure Laterality Date   CESAREAN SECTION     PELVIC LAPAROSCOPY     TUBAL LIGATION      OB History   No obstetric history on file.      Home Medications    Prior to Admission medications   Medication Sig Start Date End Date Taking? Authorizing Provider  amoxicillin -clavulanate (AUGMENTIN ) 875-125 MG tablet Take 1 tablet by mouth  every 12 (twelve) hours. 03/25/24  Yes Maranda Jamee Jacob, MD  chlorpheniramine-HYDROcodone  (TUSSIONEX) 10-8 MG/5ML Take 5 mLs by mouth every 12 (twelve) hours as needed for cough. 03/25/24  Yes Maranda Jamee Jacob, MD  gabapentin  (NEURONTIN ) 600 MG tablet Take 1 tablet (600 mg total) by mouth 3 (three) times daily. 03/25/24  Yes Maranda Jamee Jacob, MD  omeprazole  (PRILOSEC) 20 MG capsule Take 1 capsule (20 mg total) by mouth daily. 03/25/24  Yes Maranda Jamee Jacob, MD  predniSONE  (DELTASONE ) 20 MG tablet Take 2 tablets (40 mg total) by mouth daily with breakfast. 03/25/24  Yes Maranda Jamee Jacob, MD  albuterol  (VENTOLIN  HFA) 108 [phone] Base) MCG/ACT inhaler Inhale 1-2 puffs into the lungs every 6 (six) hours as needed for wheezing or shortness of breath. 02/17/24   Maranda Jamee Jacob, MD  gabapentin  (NEURONTIN ) 600 MG tablet Take by mouth. 03/12/19   [provider]  metoprolol succinate (TOPROL-XL) 100 MG 24 hr tablet Take by mouth. 09/25/15   [provider]    Family History Family History  Problem Relation Age of Onset   Endometriosis Mother    Alcohol abuse Father    Cancer Maternal Grandmother        lung,bone,brain   Diabetes Maternal Grandmother    Stroke  Maternal Grandmother    Cancer Maternal Grandfather        prostate   Diabetes Maternal Grandfather    Heart attack Maternal Grandfather    Thyroid disease Maternal Grandfather    Endometriosis Maternal Aunt    Endometriosis Maternal Aunt     Social History Social History   Tobacco Use   Smoking status: Every Day    Current packs/day: 0.50    Types: Cigarettes   Smokeless tobacco: Never  Vaping Use   Vaping status: Former   Substances: Nicotine, Flavoring  Substance Use Topics   Alcohol use: Not Currently   Drug use: Not Currently     Allergies   Nsaids, Tramadol, Azithromycin, Aspirin, Decongestant [pseudoephedrine hcl], and Sulfamethoxazole-trimethoprim   Review of Systems Review of  Systems See HPI  Physical Exam Triage Vital Signs ED Triage Vitals  Encounter Vitals Group     BP 03/25/24 0834 117/81     Girls Systolic BP Percentile --      Girls Diastolic BP Percentile --      Boys Systolic BP Percentile --      Boys Diastolic BP Percentile --      Pulse Rate 03/25/24 0834 89     Resp 03/25/24 0834 20     Temp 03/25/24 0834 98.4 F (36.9 C)     Temp Source 03/25/24 0834 Oral     SpO2 03/25/24 0834 97 %     Weight 03/25/24 0832 163 lb (73.9 kg)     Height --      Head Circumference --      Peak Flow --      Pain Score 03/25/24 0831 7     Pain Loc --      Pain Education --      Exclude from Growth Chart --    No data found.  Updated Vital Signs BP 117/81 (BP Location: Right Arm)   Pulse 89   Temp 98.4 F (36.9 C) (Oral)   Resp 20   Wt 73.9 kg   LMP 03/21/2024 (Exact Date)   SpO2 97%   BMI 26.31 kg/m       Physical Exam Constitutional:      General: She is not in acute distress.    Appearance: She is well-developed. She is ill-appearing.  HENT:     Head: Normocephalic and atraumatic.     Mouth/Throat:     Mouth: Mucous membranes are dry.     Comments: Mucous membranes are dry Eyes:     Conjunctiva/sclera: Conjunctivae normal.     Pupils: Pupils are equal, round, and reactive to light.  Cardiovascular:     Rate and Rhythm: Normal rate and regular rhythm.     Heart sounds: Normal heart sounds.  Pulmonary:     Effort: Pulmonary effort is normal. No respiratory distress.     Breath sounds: Normal breath sounds.  Abdominal:     General: There is no distension.     Palpations: Abdomen is soft.  Musculoskeletal:        General: Normal range of motion.     Cervical back: Normal range of motion.  Lymphadenopathy:     Cervical: Cervical adenopathy present.  Skin:    General: Skin is warm and dry.  Neurological:     Mental Status: She is alert.      UC Treatments / Results  Labs (all labs ordered are listed, but only abnormal  results are displayed) Labs Reviewed - No data to display  EKG   Radiology No results found.  Procedures Procedures (including critical care time)  Medications Ordered in UC Medications - No data to display  Initial Impression / Assessment and Plan / UC Course  I have reviewed the triage vital signs and the nursing notes.  Pertinent labs & imaging results that were available during my care of the patient were reviewed by me and considered in my medical decision making (see chart for details).     Discussed that her cough could be from postnasal drip, GERD, underlying lung disease.  I told her that at urgent care we treat acute cough but chronic coughs require PCP evaluation.  I will give her 1 more course of antibiotics and steroids.  PCP follow-up was urged.  An appointment is made for her in 3 weeks Final Clinical Impressions(s) / UC Diagnoses   Final diagnoses:  Subacute cough  Medication refill  Tobacco dependence     Discharge Instructions      Drink more water Take the antibiotic 2 x a day Take the prednsione as directed I have refilled your prilosec and your gabapentin  Call Monday to make an appointment for primary care   ED Prescriptions     Medication Sig Dispense Auth. Provider   amoxicillin -clavulanate (AUGMENTIN ) 875-125 MG tablet Take 1 tablet by mouth every 12 (twelve) hours. 14 tablet Maranda Jamee Jacob, MD   predniSONE  (DELTASONE ) 20 MG tablet Take 2 tablets (40 mg total) by mouth daily with breakfast. 10 tablet Maranda Jamee Jacob, MD   omeprazole  (PRILOSEC) 20 MG capsule Take 1 capsule (20 mg total) by mouth daily. 30 capsule Maranda Jamee Jacob, MD   gabapentin  (NEURONTIN ) 600 MG tablet Take 1 tablet (600 mg total) by mouth 3 (three) times daily. 90 tablet Maranda Jamee Jacob, MD   chlorpheniramine-HYDROcodone  (TUSSIONEX) 10-8 MG/5ML Take 5 mLs by mouth every 12 (twelve) hours as needed for cough. 115 mL Maranda Jamee Jacob, MD      I have reviewed  the PDMP during this encounter.   Maranda Jamee Jacob, MD 03/25/24 [phone]

## 2024-03-25 NOTE — ED Provider Notes (Signed)
 Jenny Oconnell    CSN: [id number] Arrival date & time: 03/24/24  9061      History   Chief Complaint No chief complaint on file.   HPI Jenny Oconnell is a 38 y.o. female.   HPI  Past Medical History:  Diagnosis Date   Anxiety    Cancer (HCC)    Endometriosis    Migraine    Migraines     Patient Active Problem List   Diagnosis Date Noted   Substance abuse (HCC) 06/10/2022   History of repeated overdose 06/10/2022   Migraine 06/10/2022   Acute neck pain 07/11/2019   Chest wall pain 07/11/2019   Chronic pain syndrome 07/11/2019    Past Surgical History:  Procedure Laterality Date   CESAREAN SECTION     PELVIC LAPAROSCOPY     TUBAL LIGATION      OB History   No obstetric history on file.      Home Medications    Prior to Admission medications   Medication Sig Start Date End Date Taking? Authorizing Provider  albuterol  (VENTOLIN  HFA) 108 (90 Base) MCG/ACT inhaler Inhale 1-2 puffs into the lungs every 6 (six) hours as needed for wheezing or shortness of breath. 02/17/24   Maranda Jamee Jacob, MD  amoxicillin -clavulanate (AUGMENTIN ) 875-125 MG tablet Take 1 tablet by mouth every 12 (twelve) hours. 03/25/24   Maranda Jamee Jacob, MD  chlorpheniramine-HYDROcodone  (TUSSIONEX) 10-8 MG/5ML Take 5 mLs by mouth every 12 (twelve) hours as needed for cough. 03/25/24   Maranda Jamee Jacob, MD  gabapentin  (NEURONTIN ) 600 MG tablet Take by mouth. 03/12/19   [provider]  gabapentin  (NEURONTIN ) 600 MG tablet Take 1 tablet (600 mg total) by mouth 3 (three) times daily. 03/25/24   Maranda Jamee Jacob, MD  metoprolol succinate (TOPROL-XL) 100 MG 24 hr tablet Take by mouth. 09/25/15   [provider]  omeprazole  (PRILOSEC) 20 MG capsule Take 1 capsule (20 mg total) by mouth daily. 03/25/24   Maranda Jamee Jacob, MD  predniSONE  (DELTASONE ) 20 MG tablet Take 2 tablets (40 mg total) by mouth daily with breakfast. 03/25/24   Maranda Jamee Jacob, MD    Family  History Family History  Problem Relation Age of Onset   Endometriosis Mother    Alcohol abuse Father    Cancer Maternal Grandmother        lung,bone,brain   Diabetes Maternal Grandmother    Stroke Maternal Grandmother    Cancer Maternal Grandfather        prostate   Diabetes Maternal Grandfather    Heart attack Maternal Grandfather    Thyroid disease Maternal Grandfather    Endometriosis Maternal Aunt    Endometriosis Maternal Aunt     Social History Social History   Tobacco Use   Smoking status: Every Day    Current packs/day: 0.50    Types: Cigarettes   Smokeless tobacco: Never  Vaping Use   Vaping status: Former   Substances: Nicotine, Flavoring  Substance Use Topics   Alcohol use: Not Currently   Drug use: Not Currently     Allergies   Nsaids, Tramadol, Azithromycin, Aspirin, Decongestant [pseudoephedrine hcl], and Sulfamethoxazole-trimethoprim   Review of Systems Review of Systems   Physical Exam Triage Vital Signs ED Triage Vitals  Encounter Vitals Group     BP 03/24/24 1117 108/75     Girls Systolic BP Percentile --      Girls Diastolic BP Percentile --      Boys Systolic BP Percentile --  Boys Diastolic BP Percentile --      Pulse Rate 03/24/24 1117 70     Resp 03/24/24 1117 18     Temp 03/24/24 1117 98.6 F (37 C)     Temp Source 03/24/24 1117 Oral     SpO2 03/24/24 1117 97 %     Weight --      Height --      Head Circumference --      Peak Flow --      Pain Score 03/24/24 1111 7     Pain Loc --      Pain Education --      Exclude from Growth Chart --    No data found.  Updated Vital Signs BP 108/75 (BP Location: Right Arm)   Pulse 70   Temp 98.6 F (37 C) (Oral)   Resp 18   LMP 03/21/2024 (Exact Date)   SpO2 97%   Visual Acuity Right Eye Distance:   Left Eye Distance:   Bilateral Distance:    Right Eye Near:   Left Eye Near:    Bilateral Near:     Physical Exam   UC Treatments / Results  Labs (all labs ordered  are listed, but only abnormal results are displayed) Labs Reviewed - No data to display  EKG   Radiology No results found.  Procedures Procedures (including critical Oconnell time)  Medications Ordered in UC Medications - No data to display  Initial Impression / Assessment and Plan / UC Course  I have reviewed the triage vital signs and the nursing notes.  Pertinent labs & imaging results that were available during my Oconnell of the patient were reviewed by me and considered in my medical decision making (see chart for details).    Patient left without being seen.  Final Clinical Impressions(s) / UC Diagnoses   Final diagnoses:  None   Discharge Instructions   None    ED Prescriptions   None    PDMP not reviewed this encounter.   Pauline Garnette LABOR, MD 03/25/24 [phone]

## 2024-03-25 NOTE — ED Triage Notes (Signed)
 Patient states she was seen last month and had bronchitis, feels like she like she never got better, persistent cough, sometimes brown mucus and chills.   Also complains of right sided rib cage pain that has been present since last month.  Not taking any OTC meds

## 2024-03-25 NOTE — Discharge Instructions (Signed)
 Drink more water Take the antibiotic 2 x a day Take the prednsione as directed I have refilled your prilosec and your gabapentin  Call Monday to make an appointment for primary care

## 2024-05-18 ENCOUNTER — Other Ambulatory Visit: Payer: Self-pay | Admitting: Urgent Care

## 2024-05-18 ENCOUNTER — Encounter: Payer: Self-pay | Admitting: Urgent Care

## 2024-05-18 ENCOUNTER — Ambulatory Visit (INDEPENDENT_AMBULATORY_CARE_PROVIDER_SITE_OTHER): Payer: MEDICAID | Admitting: Urgent Care

## 2024-05-18 VITALS — BP 104/80 | HR 70 | Ht 66.0 in | Wt 168.0 lb

## 2024-05-18 DIAGNOSIS — K219 Gastro-esophageal reflux disease without esophagitis: Secondary | ICD-10-CM

## 2024-05-18 DIAGNOSIS — N809 Endometriosis, unspecified: Secondary | ICD-10-CM

## 2024-05-18 DIAGNOSIS — R10A1 Flank pain, right side: Secondary | ICD-10-CM | POA: Diagnosis not present

## 2024-05-18 DIAGNOSIS — G4709 Other insomnia: Secondary | ICD-10-CM

## 2024-05-18 DIAGNOSIS — G8929 Other chronic pain: Secondary | ICD-10-CM

## 2024-05-18 DIAGNOSIS — N6452 Nipple discharge: Secondary | ICD-10-CM | POA: Diagnosis not present

## 2024-05-18 DIAGNOSIS — H538 Other visual disturbances: Secondary | ICD-10-CM

## 2024-05-18 DIAGNOSIS — Z7689 Persons encountering health services in other specified circumstances: Secondary | ICD-10-CM

## 2024-05-18 DIAGNOSIS — M546 Pain in thoracic spine: Secondary | ICD-10-CM | POA: Diagnosis not present

## 2024-05-18 DIAGNOSIS — I059 Rheumatic mitral valve disease, unspecified: Secondary | ICD-10-CM

## 2024-05-18 DIAGNOSIS — F17200 Nicotine dependence, unspecified, uncomplicated: Secondary | ICD-10-CM

## 2024-05-18 DIAGNOSIS — R519 Headache, unspecified: Secondary | ICD-10-CM

## 2024-05-18 DIAGNOSIS — R053 Chronic cough: Secondary | ICD-10-CM

## 2024-05-18 DIAGNOSIS — Z79899 Other long term (current) drug therapy: Secondary | ICD-10-CM

## 2024-05-18 LAB — POCT URINALYSIS DIP (CLINITEK)
Bilirubin, UA: NEGATIVE
Glucose, UA: NEGATIVE mg/dL
Ketones, POC UA: NEGATIVE mg/dL
Nitrite, UA: NEGATIVE
Spec Grav, UA: 1.02
Urobilinogen, UA: 0.2 U/dL
pH, UA: 6.5

## 2024-05-18 MED ORDER — OMEPRAZOLE 20 MG PO CPDR: 20.0000 mg | DELAYED_RELEASE_CAPSULE | Freq: Every day | ORAL | 1 refills | Status: AC

## 2024-05-18 MED ORDER — GABAPENTIN 600 MG PO TABS: 600.0000 mg | ORAL_TABLET | Freq: Three times a day (TID) | ORAL | 2 refills | Status: AC

## 2024-05-18 MED ORDER — METOPROLOL SUCCINATE ER 100 MG PO TB24: 100.0000 mg | ORAL_TABLET | Freq: Every day | ORAL | 1 refills | Status: AC

## 2024-05-18 MED ORDER — BENZONATATE 100 MG PO CAPS: 100.0000 mg | ORAL_CAPSULE | Freq: Two times a day (BID) | ORAL | 0 refills | Status: AC | PRN

## 2024-05-18 MED ORDER — AMITRIPTYLINE HCL 50 MG PO TABS: ORAL_TABLET | ORAL | 3 refills | Status: AC

## 2024-05-18 NOTE — Patient Instructions (Addendum)
 Please obtain the diagnostic mammogram. I drew labs on you today.  Please also schedule a CT scan of your chest. I have called in a medication to see if it would help with the cough.  Please start elavil . This can help with insomnia and headaches. Take at night.  Return for follow up in 3-4 weeks. Please ensure that all imaging studies have been completed prior.

## 2024-05-18 NOTE — Progress Notes (Signed)
 "  New Patient Office Visit  Subjective:  Patient ID: Jenny Oconnell, female    DOB: 29-Nov-1985  Age: 39 y.o. MRN: [id number]  CC:  Chief Complaint  Patient presents with   Establish Care    HPI [name] presents to establish care.  Discussed the use of AI scribe software for clinical note transcription with the patient, who gave verbal consent to proceed.  History of Present Illness   Jenny Oconnell is a 39 year old female who presents with breast discharge and pain. She is accompanied by her child, Chase.  She has experienced breast discharge and pain since her pregnancy with twins in 2017. The discharge is white and thick, sometimes containing blood, with associated soreness and small bumps around the breasts. A mammogram prior to her pregnancy revealed a cyst, but she has not had a recent evaluation for these symptoms.  She has a history of lung nodules diagnosed approximately ten years ago, with regular scans delayed due to insurance issues. She experiences a persistent cough and lung pain, particularly on the right side, and uses albuterol  prescribed by urgent care for these symptoms. She smokes a pack a day since age 74.  She suffers from chronic migraines, described as constant pain in the left temple that can radiate to her face, neck, shoulder, and eye. She takes sumatriptan, which provides minimal relief, and experiences blurred vision, especially without glasses.  She has difficulty sleeping and has been prescribed Seroquel, which she recently restarted at 25 mg, but it does not help. She has tried other medications like trazodone, Ambien, and melatonin without success.  She has a history of acid reflux since infancy, for which she takes omeprazole , and it is effective. She also takes metoprolol  for a rapid heartbeat, which helps control her heart rate.  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 She takes gabapentin  600 mg three times a day, which provides some relief.  She has a history of endometriosis, diagnosed as stage four in 2008, and has had surgery for it. She experiences ongoing symptoms but has not been able to obtain a hysterectomy.  She reports a dry mouth for the past month and has noticed lines on her neck. No urinary symptoms.       Outpatient Encounter Medications as of 05/18/2024  Medication Sig   amitriptyline  (ELAVIL ) 50 MG tablet One half tab PO qHS for a week, then one tab PO qHS.   benzonatate  (TESSALON ) 100 MG capsule Take 1 capsule (100 mg total) by mouth 2 (two) times daily as needed for cough.   QUEtiapine (SEROQUEL) 25 MG tablet Take 25 mg by mouth at bedtime as needed.   SUMAtriptan (IMITREX) 50 MG tablet SMARTSIG:1-2 Tablet(s) By Mouth   [DISCONTINUED] gabapentin  (NEURONTIN ) 600 MG tablet Take by mouth.   [DISCONTINUED] metoprolol  succinate (TOPROL -XL) 100 MG 24 hr tablet Take by mouth.   [DISCONTINUED] omeprazole  (PRILOSEC) 20 MG capsule Take 1 capsule (20 mg total) by mouth daily.   albuterol  (VENTOLIN  HFA) 108 (90 Base) MCG/ACT inhaler Inhale 1-2 puffs into the lungs every 6 (six) hours as needed for wheezing or shortness of breath.   amoxicillin -clavulanate (AUGMENTIN ) 875-125 MG tablet Take 1 tablet by mouth every 12 (twelve) hours.   chlorpheniramine-HYDROcodone  (TUSSIONEX) 10-8 MG/5ML Take 5 mLs by mouth every 12 (twelve) hours as needed for cough.   gabapentin  (NEURONTIN ) 600 MG tablet Take  1 tablet (600 mg total) by mouth 3 (three) times daily.   gabapentin  (NEURONTIN ) 600 MG tablet Take 1 tablet (600 mg total) by mouth 3 (three) times daily.   metoprolol  succinate (TOPROL -XL) 100 MG 24 hr tablet Take 1 tablet (100 mg total) by mouth daily.   omeprazole  (PRILOSEC) 20 MG capsule Take 1 capsule (20 mg total) by mouth daily.   predniSONE  (DELTASONE ) 20 MG tablet Take 2 tablets (40 mg total) by mouth daily with breakfast.   No  facility-administered encounter medications on file as of 05/18/2024.    Past Medical History:  Diagnosis Date   Anxiety    Cancer (HCC)    Endometriosis    Migraine    Migraines     Past Surgical History:  Procedure Laterality Date   CESAREAN SECTION     PELVIC LAPAROSCOPY     TUBAL LIGATION      Family History  Problem Relation Age of Onset   Endometriosis Mother    Alcohol abuse Father    Cancer Maternal Grandmother        lung,bone,brain   Diabetes Maternal Grandmother    Stroke Maternal Grandmother    Cancer Maternal Grandfather        prostate   Diabetes Maternal Grandfather    Heart attack Maternal Grandfather    Thyroid disease Maternal Grandfather    Endometriosis Maternal Aunt    Endometriosis Maternal Aunt     Social History   Socioeconomic History   Marital status: Married    Spouse name: Not on file   Number of children: Not on file   Years of education: Not on file   Highest education level: Not on file  Occupational History   Not on file  Tobacco Use   Smoking status: Every Day    Current packs/day: 0.50    Types: Cigarettes   Smokeless tobacco: Never  Vaping Use   Vaping status: Former   Substances: Nicotine, Flavoring  Substance and Sexual Activity   Alcohol use: Not Currently   Drug use: Not Currently   Sexual activity: Yes    Birth control/protection: Surgical  Other Topics Concern   Not on file  Social History Narrative   Not on file   Social Drivers of Health   Tobacco Use: High Risk (05/19/2024)   Patient History    Smoking Tobacco Use: Every Day    Smokeless Tobacco Use: Never    Passive Exposure: Not on file  Financial Resource Strain: Not on file  Food Insecurity: Not on file  Transportation Needs: Not on file  Physical Activity: Not on file  Stress: Not on file  Social Connections: Unknown (09/07/2021)   Received from [facility]   Social Network    Social Network: Not on file  Intimate Partner Violence: Unknown  (08/05/2021)   Received from [facility]   HITS    Physically Hurt: Not on file    Insult or Talk Down To: Not on file    Threaten Physical Harm: Not on file    Scream or Curse: Not on file  Depression (PHQ2-9): Not on file  Alcohol Screen: Not on file  Housing: Not on file  Utilities: Not on file  Health Literacy: Not on file    ROS: as noted in HPI  Objective:  BP 104/80   Pulse 70   Ht 5' 6 (1.676 m)   Wt 168 lb (76.2 kg)   LMP 04/22/2024 (Approximate)   SpO2 96%   BMI  27.12 kg/m   Physical Exam Vitals and nursing note reviewed. Exam conducted with a chaperone present.  Constitutional:      General: She is not in acute distress.    Appearance: Normal appearance. She is not ill-appearing, toxic-appearing or diaphoretic.  HENT:     Head: Normocephalic and atraumatic.     Right Ear: Tympanic membrane, ear canal and external ear normal. There is no impacted cerumen.     Left Ear: Tympanic membrane, ear canal and external ear normal. There is no impacted cerumen.     Nose: Nose normal.     Mouth/Throat:     Mouth: Mucous membranes are moist.     Dentition: Abnormal dentition.     Pharynx: Oropharynx is clear. No oropharyngeal exudate or posterior oropharyngeal erythema.  Eyes:     General: No scleral icterus.       Right eye: No discharge.        Left eye: No discharge.     Extraocular Movements: Extraocular movements intact.     Pupils: Pupils are equal, round, and reactive to light.  Neck:     Thyroid: No thyroid mass, thyromegaly or thyroid tenderness.  Cardiovascular:     Rate and Rhythm: Normal rate and regular rhythm.     Pulses: Normal pulses.     Heart sounds: No murmur heard. Pulmonary:     Effort: Pulmonary effort is normal. No respiratory distress.     Breath sounds: Normal breath sounds. No stridor. No wheezing or rhonchi.  Chest:  Breasts:    Right: No swelling, bleeding, inverted nipple, mass, nipple discharge, skin change or tenderness.      Left: Tenderness present. No swelling, bleeding, inverted nipple, mass, nipple discharge or skin change.  Abdominal:     General: Abdomen is flat. Bowel sounds are normal. There is no distension.     Palpations: Abdomen is soft. There is no mass.     Tenderness: There is no abdominal tenderness. There is no guarding.  Musculoskeletal:     Cervical back: Normal range of motion and neck supple. No rigidity or tenderness.       Back:     Right lower leg: No edema.     Left lower leg: No edema.  Lymphadenopathy:     Cervical: No cervical adenopathy.     Upper Body:     Right upper body: No supraclavicular adenopathy.     Left upper body: No supraclavicular adenopathy.  Skin:    General: Skin is warm and dry.     Coloration: Skin is not jaundiced.     Findings: No bruising, erythema or rash.  Neurological:     General: No focal deficit present.     Mental Status: She is alert and oriented to person, place, and time.     Sensory: No sensory deficit.     Motor: No weakness.  Psychiatric:        Mood and Affect: Mood normal.        Behavior: Behavior normal.     Assessment & Plan:  Encounter to establish care -     CBC with Differential/Platelet -     CMP14+EGFR -     Lipid panel -     TSH -     VITAMIN D  25 Hydroxy (Vit-D Deficiency, Fractures) -     Vitamin B12  Breast discharge -     Prolactin -     MM 3D DIAGNOSTIC MAMMOGRAM BILATERAL BREAST; Future  Chronic  cough -     CT CHEST WO CONTRAST; Future -     Benzonatate ; Take 1 capsule (100 mg total) by mouth 2 (two) times daily as needed for cough.  Dispense: 20 capsule; Refill: 0  Chronic right-sided thoracic back pain -     Gabapentin ; Take 1 tablet (600 mg total) by mouth 3 (three) times daily.  Dispense: 90 tablet; Refill: 2  Temporal pain -     Sedimentation rate  Blurred vision, bilateral -     Vitamin B12  Gastroesophageal reflux disease without esophagitis -     Vitamin B12 -     Omeprazole ; Take 1 capsule  (20 mg total) by mouth daily.  Dispense: 30 capsule; Refill: 1  Mitral valve disorder -     Metoprolol  Succinate ER; Take 1 tablet (100 mg total) by mouth daily.  Dispense: 90 tablet; Refill: 1  Long-term use of high-risk medication -     CBC with Differential/Platelet -     CMP14+EGFR -     Lipid panel -     TSH -     VITAMIN D  25 Hydroxy (Vit-D Deficiency, Fractures) -     Vitamin B12  Smoker -     CT CHEST WO CONTRAST; Future  Right flank pain -     POCT URINALYSIS DIP (CLINITEK) -     Urine Culture  Other insomnia -     Amitriptyline  HCl; One half tab PO qHS for a week, then one tab PO qHS.  Dispense: 30 tablet; Refill: 3  Chronic daily headache -     Amitriptyline  HCl; One half tab PO qHS for a week, then one tab PO qHS.  Dispense: 30 tablet; Refill: 3  Endometriosis -     Ambulatory referral to Obstetrics / Gynecology  Assessment and Plan    Chronic bilateral nipple discharge and mastodynia Chronic soreness and discharge with possible ductal inflammation or elevated prolactin. Endometriosis may affect hormonal balance. - Ordered diagnostic mammogram. - Ordered prolactin level. - Referred to gynecology for further evaluation and management.  Chronic cough and thoracic pain with history of lung nodules Chronic cough and thoracic pain with history of lung nodules. Recent x-ray normal. - Ordered chest CT to evaluate lung nodules and thoracic pain. - Obtained urine sample to assess for kidney involvement.  Chronic daily headache with visual disturbance Chronic headaches with visual disturbances. Sumatriptan ineffective. Considering Elavil  for prevention and sleep aid. - Prescribed Elavil  for headache prevention and sleep aid.  Insomnia Chronic insomnia not relieved by previous medications. Considering Elavil  for sleep aid. - Prescribed Elavil  for sleep aid.  Gastroesophageal reflux disease Chronic GERD managed effectively with omeprazole .  Mitral valve disorder  with tachycardia Mitral valve disorder with tachycardia managed effectively with metoprolol .  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 Gabapentin  provides some relief. History of bulging discs and sciatic nerve involvement.  Endometriosis, post-surgical Stage four endometriosis with previous surgery. No current surgical intervention due to lack of provider. - Referred to gynecology for evaluation and management options.  Nicotine dependence, cigarettes Long-standing nicotine dependence, smoking one pack per day.  General health maintenance Discussion of general health maintenance and need for updated blood work. - Ordered blood work. - Scheduled follow-up appointment in two weeks.      I spent 60 minutes of total time managing this patient today, this includes chart review, face to face, and non-face to face time, reviewing outside records and labs and providing personal interpretation.  Return in about 2 weeks (around 06/01/2024).   Benton LITTIE Gave, PA  "

## 2024-05-19 ENCOUNTER — Encounter: Payer: Self-pay | Admitting: Urgent Care

## 2024-05-19 LAB — CBC WITH DIFFERENTIAL/PLATELET
Basophils Absolute: 0.1 x10E3/uL (ref 0.0–0.2)
Basos: 1 %
EOS (ABSOLUTE): 0.5 x10E3/uL — ABNORMAL HIGH (ref 0.0–0.4)
Eos: 5 %
Hematocrit: 42.9 % (ref 34.0–46.6)
Hemoglobin: 14.5 g/dL (ref 11.1–15.9)
Immature Grans (Abs): 0 x10E3/uL (ref 0.0–0.1)
Immature Granulocytes: 0 %
Lymphocytes Absolute: 4.4 x10E3/uL — ABNORMAL HIGH (ref 0.7–3.1)
Lymphs: 42 %
MCH: 31.8 pg (ref 26.6–33.0)
MCHC: 33.8 g/dL (ref 31.5–35.7)
MCV: 94 fL (ref 79–97)
Monocytes Absolute: 0.5 x10E3/uL (ref 0.1–0.9)
Monocytes: 5 %
Neutrophils Absolute: 5 x10E3/uL (ref 1.4–7.0)
Neutrophils: 47 %
Platelets: 218 x10E3/uL (ref 150–450)
RBC: 4.56 x10E6/uL (ref 3.77–5.28)
RDW: 13.1 % (ref 11.7–15.4)
WBC: 10.5 x10E3/uL (ref 3.4–10.8)

## 2024-05-19 LAB — LIPID PANEL
Chol/HDL Ratio: 8.7 ratio — ABNORMAL HIGH (ref 0.0–4.4)
Cholesterol, Total: 227 mg/dL — ABNORMAL HIGH (ref 100–199)
HDL: 26 mg/dL — ABNORMAL LOW
LDL Chol Calc (NIH): 125 mg/dL — ABNORMAL HIGH (ref 0–99)
Triglycerides: 427 mg/dL — ABNORMAL HIGH (ref 0–149)
VLDL Cholesterol Cal: 76 mg/dL — ABNORMAL HIGH (ref 5–40)

## 2024-05-19 LAB — CMP14+EGFR
ALT: 20 IU/L (ref 0–32)
AST: 21 IU/L (ref 0–40)
Albumin: 4.2 g/dL (ref 3.9–4.9)
Alkaline Phosphatase: 162 IU/L — ABNORMAL HIGH (ref 41–116)
BUN/Creatinine Ratio: 12 (ref 9–23)
BUN: 8 mg/dL (ref 6–20)
Bilirubin Total: <0.2 mg/dL (ref 0.0–1.2)
CO2: 21 mmol/L (ref 20–29)
Calcium: 9.6 mg/dL (ref 8.7–10.2)
Chloride: 102 mmol/L (ref 96–106)
Creatinine, Ser: 0.69 mg/dL (ref 0.57–1.00)
Globulin, Total: 2.9 g/dL (ref 1.5–4.5)
Glucose: 69 mg/dL — ABNORMAL LOW (ref 70–99)
Potassium: 4.3 mmol/L (ref 3.5–5.2)
Sodium: 141 mmol/L (ref 134–144)
Total Protein: 7.1 g/dL (ref 6.0–8.5)
eGFR: 115 mL/min/1.73

## 2024-05-19 LAB — TSH: TSH: 3.71 u[IU]/mL (ref 0.450–4.500)

## 2024-05-19 LAB — PROLACTIN: Prolactin: 19.9 ng/mL (ref 4.8–33.4)

## 2024-05-19 LAB — SEDIMENTATION RATE: Sed Rate: 36 mm/h — ABNORMAL HIGH (ref 0–32)

## 2024-05-19 LAB — VITAMIN D 25 HYDROXY (VIT D DEFICIENCY, FRACTURES): Vit D, 25-Hydroxy: 12.3 ng/mL — ABNORMAL LOW (ref 30.0–100.0)

## 2024-05-19 LAB — VITAMIN B12: Vitamin B-12: 400 pg/mL (ref 232–1245)

## 2024-05-20 LAB — URINE CULTURE

## 2024-05-21 ENCOUNTER — Ambulatory Visit: Payer: Self-pay | Admitting: Urgent Care

## 2024-05-22 NOTE — Telephone Encounter (Signed)
 Copied from CRM [id number]. Topic: Clinical - Medication Question >> May 22, 2024 12:09 PM Myrick T wrote: Reason for CRM: patient called to see if provider forgot to send in the script for the antibiotic as the pharmacy never recvd it. She also wanted to see if provider would call in something for her upset stomach as she is still having the same symptoms she was having at her appt

## 2024-05-23 ENCOUNTER — Other Ambulatory Visit: Payer: Self-pay | Admitting: Urgent Care

## 2024-05-23 MED ORDER — DOXYCYCLINE HYCLATE 100 MG PO TABS: 100.0000 mg | ORAL_TABLET | Freq: Two times a day (BID) | ORAL | 0 refills | Status: AC

## 2024-05-23 NOTE — Progress Notes (Signed)
 Called in abx that were discussed at office visit to cover for suspected lower respiratory tract infection,

## 2024-05-25 ENCOUNTER — Other Ambulatory Visit: Payer: Self-pay | Admitting: Urgent Care

## 2024-05-25 ENCOUNTER — Other Ambulatory Visit

## 2024-05-25 DIAGNOSIS — N6452 Nipple discharge: Secondary | ICD-10-CM

## 2024-05-25 DIAGNOSIS — N644 Mastodynia: Secondary | ICD-10-CM

## 2024-05-30 ENCOUNTER — Encounter

## 2024-05-30 ENCOUNTER — Other Ambulatory Visit

## 2024-06-01 ENCOUNTER — Ambulatory Visit: Admitting: Urgent Care

## 2024-06-05 ENCOUNTER — Other Ambulatory Visit

## 2024-06-05 ENCOUNTER — Ambulatory Visit: Payer: Self-pay

## 2024-06-05 NOTE — Telephone Encounter (Signed)
 FYI Only or Action Required?: FYI only for provider: ED advised and pt refused.  Patient was last seen in primary care on 05/18/2024 by [name], PA.  Called Nurse Triage reporting Neck Pain.  Symptoms began several weeks ago.  Interventions attempted: Nothing.  Symptoms are: rapidly worsening.  Triage Disposition: Go to ED Now (Notify PCP)  Patient/caregiver understands and will follow disposition?: No, refuses disposition  Reason for Disposition  [1] Stiff neck (can't put chin to chest) AND [2] headache  Answer Assessment - Initial Assessment Questions Pt calling to report pain in the neck - pain goes from the front right of the neck to the back of the neck - onset 05/18/24.Jenny Oconnell Pt states the pain is inside and outside and is extremely painful to the touch, front right side of her neck is swollen and tight, lost voice 3 days ago, pain 8/10, hurts to talk, intermittent sharp pain that shoots to the top of head and right ear.  Pt advised ED based on protocol - pt refused stating she does not have transportation to get there. Writer offered to call an ambulance - pt declined.   1. ONSET: When did the pain begin?      05/18/24 2. LOCATION: Where does it hurt?      Front right side of neck to the back of the neck 3. PATTERN Does the pain come and go, or has it been constant since it started?      Constant  4. SEVERITY: How bad is the pain?  (Scale 0-10; or none or slight stiffness, mild, moderate, severe)     8/10 5. RADIATION: Does the pain go anywhere else, shoot into your arms?     From front right side to back of neck 6. CORD SYMPTOMS: Any weakness or numbness of the arms or legs?     Denies  7. CAUSE: What do you think is causing the neck pain?     Pt unsure  8. NECK OVERUSE: Any recent activities that involved turning or twisting the neck?     N/a  9. OTHER SYMPTOMS: Do you have any other symptoms? (e.g., headache, fever, chest pain, difficulty  breathing, neck swelling)     Neck swelling 10. PREGNANCY: Is there any chance you are pregnant? When was your last menstrual period?       N/a  Protocols used: Neck Pain or Stiffness-A-AH  Reason for Triage: pain on inside and outside of neck and swelling

## 2024-06-05 NOTE — Telephone Encounter (Signed)
 Okay, I will address with pt tomorrow. Thank you

## 2024-06-05 NOTE — Telephone Encounter (Signed)
Noted. TE will be closed.

## 2024-06-06 ENCOUNTER — Ambulatory Visit: Admitting: Urgent Care

## 2024-06-06 VITALS — BP 106/72 | HR 68 | Ht 66.0 in | Wt 170.0 lb

## 2024-06-06 DIAGNOSIS — E559 Vitamin D deficiency, unspecified: Secondary | ICD-10-CM

## 2024-06-06 DIAGNOSIS — R590 Localized enlarged lymph nodes: Secondary | ICD-10-CM

## 2024-06-06 DIAGNOSIS — K0510 Chronic gingivitis, plaque induced: Secondary | ICD-10-CM | POA: Diagnosis not present

## 2024-06-06 DIAGNOSIS — M436 Torticollis: Secondary | ICD-10-CM

## 2024-06-06 DIAGNOSIS — M542 Cervicalgia: Secondary | ICD-10-CM

## 2024-06-06 DIAGNOSIS — E782 Mixed hyperlipidemia: Secondary | ICD-10-CM | POA: Diagnosis not present

## 2024-06-06 MED ORDER — KETOROLAC TROMETHAMINE 60 MG/2ML IM SOLN
60.0000 mg | Freq: Once | INTRAMUSCULAR | Status: AC
  Administered 2024-06-06: 60 mg via INTRAMUSCULAR

## 2024-06-06 MED ORDER — BACLOFEN 10 MG PO TABS: 10.0000 mg | ORAL_TABLET | Freq: Three times a day (TID) | ORAL | 0 refills | Status: AC

## 2024-06-06 MED ORDER — AMOXICILLIN-POT CLAVULANATE 875-125 MG PO TABS: 1.0000 | ORAL_TABLET | Freq: Two times a day (BID) | ORAL | 0 refills | Status: AC

## 2024-06-06 MED ORDER — CEFTRIAXONE SODIUM 1 G IJ SOLR
1.0000 g | Freq: Once | INTRAMUSCULAR | Status: AC
  Administered 2024-06-06: 1 g via INTRAMUSCULAR

## 2024-06-06 MED ORDER — CHLORHEXIDINE GLUCONATE 0.12 % MT SOLN: 15.0000 mL | Freq: Two times a day (BID) | OROMUCOSAL | 0 refills | Status: AC

## 2024-06-06 NOTE — Progress Notes (Unsigned)
 "  Established Patient Office Visit  Subjective:  Patient ID: Jenny Oconnell, female    DOB: 04-21-1986  Age: 39 y.o. MRN: [id number]  Chief Complaint  Patient presents with   Neck Pain    Right sided    HPI  Patient Active Problem List   Diagnosis Date Noted   Substance abuse (HCC) 06/10/2022   History of repeated overdose 06/10/2022   Migraine 06/10/2022   Acute neck pain 07/11/2019   Chest wall pain 07/11/2019   Chronic pain syndrome 07/11/2019   Generalized anxiety disorder 05/28/2019   Menorrhagia with regular cycle 01/03/2016   Posttraumatic stress disorder 02/27/2013   Endometriosis 08/11/2011   Mitral valve disorder 06/29/2008   Past Medical History:  Diagnosis Date   Anxiety    Cancer (HCC)    Endometriosis    Migraine    Migraines    Past Surgical History:  Procedure Laterality Date   CESAREAN SECTION     PELVIC LAPAROSCOPY     TUBAL LIGATION     Social History[1]    ROS: as noted in HPI  Objective:     BP 104/70   Pulse 68   Ht 5' 6 (1.676 m)   Wt 170 lb (77.1 kg)   LMP 04/22/2024 (Approximate)   SpO2 99%   BMI 27.44 kg/m  BP Readings from Last 3 Encounters:  06/06/24 104/70  05/18/24 104/80  03/25/24 117/81   Wt Readings from Last 3 Encounters:  06/06/24 170 lb (77.1 kg)  05/18/24 168 lb (76.2 kg)  03/25/24 163 lb (73.9 kg)      Physical Exam   No results found for any visits on 06/06/24.  Last CBC Lab Results  Component Value Date   WBC 10.5 05/18/2024   HGB 14.5 05/18/2024   HCT 42.9 05/18/2024   MCV 94 05/18/2024   MCH 31.8 05/18/2024   RDW 13.1 05/18/2024   PLT 218 05/18/2024   Last metabolic panel Lab Results  Component Value Date   GLUCOSE 69 (L) 05/18/2024   NA 141 05/18/2024   K 4.3 05/18/2024   CL 102 05/18/2024   CO2 21 05/18/2024   BUN 8 05/18/2024   CREATININE 0.69 05/18/2024   EGFR 115 05/18/2024   CALCIUM 9.6 05/18/2024   PROT 7.1 05/18/2024   ALBUMIN 4.2 05/18/2024   LABGLOB  2.9 05/18/2024   BILITOT <0.2 05/18/2024   ALKPHOS 162 (H) 05/18/2024   AST 21 05/18/2024   ALT 20 05/18/2024   Last lipids Lab Results  Component Value Date   CHOL 227 (H) 05/18/2024   HDL 26 (L) 05/18/2024   LDLCALC 125 (H) 05/18/2024   TRIG 427 (H) 05/18/2024   CHOLHDL 8.7 (H) 05/18/2024   Last hemoglobin A1c No results found for: HGBA1C Last thyroid functions Lab Results  Component Value Date   TSH 3.710 05/18/2024   Last vitamin D  Lab Results  Component Value Date   VD25OH 12.3 (L) 05/18/2024   Last vitamin B12 and Folate Lab Results  Component Value Date   VITAMINB12 400 05/18/2024      The ASCVD Risk score (Arnett DK, et al., 2019) failed to calculate for the following reasons:   The 2019 ASCVD risk score is only valid for ages 56 to 54  Assessment & Plan:  There are no diagnoses linked to this encounter.   No follow-ups on file.   Benton LITTIE Gave, PA      [1] Social History Tobacco Use   Smoking status: Every Day  Current packs/day: 0.50    Types: Cigarettes   Smokeless tobacco: Never  Vaping Use   Vaping status: Former   Substances: Nicotine, Flavoring  Substance Use Topics   Alcohol use: Not Currently   Drug use: Not Currently  "

## 2024-06-07 ENCOUNTER — Encounter: Payer: Self-pay | Admitting: Urgent Care

## 2024-06-07 MED ORDER — FENOFIBRATE 145 MG PO TABS: 145.0000 mg | ORAL_TABLET | Freq: Every day | ORAL | 1 refills | Status: AC

## 2024-06-07 MED ORDER — VITAMIN D (ERGOCALCIFEROL) 1.25 MG (50000 UNIT) PO CAPS: 50000.0000 [IU] | ORAL_CAPSULE | ORAL | 0 refills | Status: AC

## 2024-06-14 ENCOUNTER — Other Ambulatory Visit

## 2024-06-20 ENCOUNTER — Other Ambulatory Visit

## 2024-06-20 ENCOUNTER — Encounter

## 2024-07-04 ENCOUNTER — Ambulatory Visit: Admitting: Urgent Care
